# Patient Record
Sex: Female | Born: 1985 | Race: White | Hispanic: No | Marital: Married | Smoking: Former smoker
Health system: Southern US, Community
[De-identification: ages and names within clinical notes are randomized; demographics above are authoritative.]

## PROBLEM LIST (undated history)

## (undated) ENCOUNTER — Inpatient Hospital Stay (HOSPITAL_COMMUNITY): Payer: Self-pay

## (undated) ENCOUNTER — Emergency Department (HOSPITAL_COMMUNITY): Admission: EM | Discharge status: Against Medical Advice | Payer: Managed Care, Other (non HMO)

## (undated) DIAGNOSIS — R112 Nausea with vomiting, unspecified: Secondary | ICD-10-CM

## (undated) DIAGNOSIS — F419 Anxiety disorder, unspecified: Secondary | ICD-10-CM

## (undated) DIAGNOSIS — N159 Renal tubulo-interstitial disease, unspecified: Secondary | ICD-10-CM

## (undated) DIAGNOSIS — A0472 Enterocolitis due to Clostridium difficile, not specified as recurrent: Secondary | ICD-10-CM

## (undated) DIAGNOSIS — K259 Gastric ulcer, unspecified as acute or chronic, without hemorrhage or perforation: Secondary | ICD-10-CM

## (undated) DIAGNOSIS — Z9889 Other specified postprocedural states: Secondary | ICD-10-CM

## (undated) DIAGNOSIS — J45909 Unspecified asthma, uncomplicated: Secondary | ICD-10-CM

## (undated) DIAGNOSIS — N83209 Unspecified ovarian cyst, unspecified side: Secondary | ICD-10-CM

## (undated) HISTORY — PX: EYE SURGERY: SHX253

## (undated) HISTORY — DX: Unspecified ovarian cyst, unspecified side: N83.209

## (undated) HISTORY — PX: BREAST ENHANCEMENT SURGERY: SHX7

## (undated) HISTORY — DX: Gastric ulcer, unspecified as acute or chronic, without hemorrhage or perforation: K25.9

## (undated) HISTORY — PX: LIPOSUCTION: SHX10

---

## 2011-03-24 ENCOUNTER — Ambulatory Visit: Payer: BC Managed Care – PPO

## 2011-03-24 DIAGNOSIS — M79609 Pain in unspecified limb: Secondary | ICD-10-CM

## 2011-07-29 ENCOUNTER — Encounter (HOSPITAL_COMMUNITY): Payer: Self-pay | Admitting: Emergency Medicine

## 2011-07-29 ENCOUNTER — Emergency Department (HOSPITAL_COMMUNITY)
Admission: EM | Admit: 2011-07-29 | Discharge: 2011-07-29 | Disposition: A | Discharge status: Home / Self Care | Payer: BC Managed Care – PPO | Attending: Emergency Medicine | Admitting: Emergency Medicine

## 2011-07-29 ENCOUNTER — Emergency Department (HOSPITAL_COMMUNITY): Payer: BC Managed Care – PPO

## 2011-07-29 DIAGNOSIS — M549 Dorsalgia, unspecified: Secondary | ICD-10-CM | POA: Insufficient documentation

## 2011-07-29 DIAGNOSIS — R202 Paresthesia of skin: Secondary | ICD-10-CM

## 2011-07-29 DIAGNOSIS — M79609 Pain in unspecified limb: Secondary | ICD-10-CM

## 2011-07-29 DIAGNOSIS — R450 Nervousness: Secondary | ICD-10-CM | POA: Insufficient documentation

## 2011-07-29 DIAGNOSIS — R209 Unspecified disturbances of skin sensation: Secondary | ICD-10-CM | POA: Insufficient documentation

## 2011-07-29 DIAGNOSIS — F17200 Nicotine dependence, unspecified, uncomplicated: Secondary | ICD-10-CM | POA: Insufficient documentation

## 2011-07-29 DIAGNOSIS — M79659 Pain in unspecified thigh: Secondary | ICD-10-CM

## 2011-07-29 HISTORY — DX: Anxiety disorder, unspecified: F41.9

## 2011-07-29 MED ORDER — ALPRAZOLAM 0.5 MG PO TABS
0.5000 mg | ORAL_TABLET | Freq: Once | ORAL | Status: AC
  Administered 2011-07-29: 0.5 mg via ORAL
  Filled 2011-07-29: qty 1

## 2011-07-29 MED ORDER — IBUPROFEN 600 MG PO TABS: 600.0000 mg | ORAL_TABLET | Freq: Four times a day (QID) | ORAL | Status: AC | PRN

## 2011-07-29 NOTE — ED Notes (Addendum)
Pt states she was on a 9 hr road trip yesterday and she was awoke from her sleep for C/O pain to Left thigh.  She came to ER because nothing relieved it/.  She has gone for an [place] now

## 2011-07-29 NOTE — ED Notes (Signed)
MD at bedside. Dr. Stienl at bedside.  

## 2011-07-29 NOTE — ED Notes (Signed)
Pt states she drove for 9 hrs on Monday and tonight she woke up with pain and numbness in her left leg   Pt states pain is in her thigh  Pt states it went completely numb and made it difficult for her to walk  Pt states now it feels heavy and sore

## 2011-07-29 NOTE — ED Notes (Signed)
Pt states she woke up at 3AM and had pain in her L thigh and states that her L leg went numb. She says she got home from a 9 hr road trip yesterday evening. She only stopped once and got out of the car. She states her L calf feels tight but denies pain in calf. She continues to c/o soreness and tightness to L thigh. Strong pedal pulses present bilaterally.

## 2011-07-29 NOTE — ED Notes (Signed)
Pt's family member called and demanded to speak to MD. Family member states she is a Engineer, civil (consulting) and will sue ER if "something is wrong with my sister's leg". Attempted to explain delay to pt's family member. Family member shouting on phone. Informed family member that patient is stable and waiting to see MD. Informed family member of HIPPA policy. Family member continues to interrupt explanation. Call deferred to Charge Nurse, Terri.

## 2011-07-29 NOTE — Progress Notes (Signed)
VASCULAR LAB PRELIMINARY  PRELIMINARY  PRELIMINARY  PRELIMINARY  Left lower extremity venous duplex completed.    Preliminary report:  Left:  No evidence of DVT, superficial thrombosis, or Baker's cyst.  Litzy Dicker D, RVS 07/29/2011, 8:50 AM

## 2011-07-29 NOTE — Discharge Instructions (Signed)
Your xrays and vascular doppler tests were read as being normal.  Take motrin as need for pain. Follow up with primary care doctor in 1 week if symptoms fail to improve/resolve. Return to ER if worse, leg swelling, redness, severe pain, numbness/weakness, fevers,  other concern.

## 2011-07-29 NOTE — ED Provider Notes (Signed)
History     CSN: [id number]  Arrival date & time 07/29/11  0341   First MD Initiated Contact with Patient 07/29/11 [phone]      Chief Complaint  Patient presents with  . Leg Pain    (Consider location/radiation/quality/duration/timing/severity/associated sxs/prior treatment) Patient is a 26 y.o. female presenting with leg pain. The history is provided by the patient.  Leg Pain   pt c/o awakening w left leg/thigh pain this morning. Constant, dull. Denies leg swelling. States recent 9 hr car trip w single stop. No hx dvt or pe. No cp or sob. States felt normal when went to bed, denies sleeping in funny or awkward position. Denies same symptoms previously. States left leg also felt numb/tingly. No weakness. Also notes some pain left lower back/buttock area. No hx ddd. No back injury or strain. No fever or chills.   Past Medical History  Diagnosis Date  . Anxiety     History reviewed. No pertinent past surgical history.  Family History  Problem Relation Age of Onset  . Diabetes Mother   . Cancer Other   . Diabetes Other   . Heart attack Other     History  Substance Use Topics  . Smoking status: Current Everyday Smoker    Types: Cigarettes  . Smokeless tobacco: Not on file  . Alcohol Use: Yes     rare    OB History    Grav Para Term Preterm Abortions TAB SAB Ect Mult Living                  Review of Systems  Constitutional: Negative for fever.  Gastrointestinal: Negative for abdominal pain.  Musculoskeletal: Positive for back pain.  Skin: Negative for rash.  Neurological: Negative for weakness.  Psychiatric/Behavioral: The patient is nervous/anxious.     Allergies  Dilaudid and Sulfa antibiotics  Home Medications  No current outpatient prescriptions on file.  BP 147/87  Pulse 90  Temp(Src) 97.9 F (36.6 C) (Oral)  Resp 16  Ht 5\' 10"  (1.778 m)  Wt 220 lb (99.791 kg)  BMI 31.57 kg/m2  SpO2 100%  LMP 07/06/2011  Physical Exam  Nursing note and vitals  reviewed. Constitutional: She is oriented to person, place, and time. She appears well-developed and well-nourished. No distress.  Eyes: No scleral icterus.  Neck: No tracheal deviation present.  Cardiovascular: Normal rate, regular rhythm, normal heart sounds and intact distal pulses.   Pulmonary/Chest: Effort normal and breath sounds normal. No respiratory distress.  Abdominal: Normal appearance. She exhibits no distension.  Musculoskeletal: She exhibits no edema.       ctls spine aligned no step off, non tender. Left lumbar tenderness, tenderness at left sciatic notch. Straight leg raise neg. Good rom at left hip and knee without pain. Distal pulses palp. No sts or focal bony tenderness to left thight. No skin changes or erythema.   Neurological: She is alert and oriented to person, place, and time.       Motor intact bil.   Skin: Skin is warm and dry. No rash noted.  Psychiatric: She has a normal mood and affect.    ED Course  Procedures (including critical care time) Dg Lumbar Spine Complete  07/29/2011  *RADIOLOGY REPORT*  Clinical Data: Left leg pain and weakness for 1 day.  LUMBAR SPINE - COMPLETE 4+ VIEW  Comparison: None.  Findings: There are five lumbar type vertebral bodies.  Alignment is normal.  There is no evidence of fracture or pars defect.  The lumbar disc spaces are preserved.  IMPRESSION: Normal lumbar spine radiographs.  Original Report Authenticated By: Gerrianne Scale, M.D.      MDM  C/o left leg pain, recent prolonged road trip in car. Will get vascular dopplers.  Pt also notes hx anxiety, states after symptoms began this morning she felt very anxious as if was having anxiety attack. Pt given reassurance. Xanax po.    Vascular doppler report:    Patient Information       Patient Name  Sex  DOB  SSN    Deanna, Norton  Female  21-Sep-1985  [id number]             Progress Notes signed by Kerrin Champagne at 07/29/11 4098     Author:  Kerrin Champagne  Service:  (none)  Author Type:  Technician   Filed:  07/29/11 0852  Note Time:  07/29/11 0850          VASCULAR LAB  PRELIMINARY PRELIMINARY PRELIMINARY PRELIMINARY  Left lower extremity venous duplex completed.  Preliminary report: Left: No evidence of DVT, superficial thrombosis, or Baker's cyst.  SLAUGHTER, [place] D, RVS  07/29/2011, 8:50 AM    Discussed results w pt.   Recommend close pcp f/u. Return if worsening pain, swelling, other new symptoms.      Suzi Roots, MD 07/29/11 [phone]

## 2011-08-11 ENCOUNTER — Encounter: Payer: Self-pay | Admitting: Family Medicine

## 2011-08-11 ENCOUNTER — Ambulatory Visit (INDEPENDENT_AMBULATORY_CARE_PROVIDER_SITE_OTHER): Payer: Managed Care, Other (non HMO) | Admitting: Family Medicine

## 2011-08-11 VITALS — BP 130/98 | Temp 98.5°F | Ht 67.5 in | Wt 258.0 lb

## 2011-08-11 DIAGNOSIS — Z720 Tobacco use: Secondary | ICD-10-CM

## 2011-08-11 DIAGNOSIS — K829 Disease of gallbladder, unspecified: Secondary | ICD-10-CM | POA: Insufficient documentation

## 2011-08-11 DIAGNOSIS — K900 Celiac disease: Secondary | ICD-10-CM

## 2011-08-11 DIAGNOSIS — R5383 Other fatigue: Secondary | ICD-10-CM | POA: Insufficient documentation

## 2011-08-11 DIAGNOSIS — E739 Lactose intolerance, unspecified: Secondary | ICD-10-CM | POA: Insufficient documentation

## 2011-08-11 DIAGNOSIS — F17200 Nicotine dependence, unspecified, uncomplicated: Secondary | ICD-10-CM

## 2011-08-11 DIAGNOSIS — E663 Overweight: Secondary | ICD-10-CM | POA: Insufficient documentation

## 2011-08-11 DIAGNOSIS — K9041 Non-celiac gluten sensitivity: Secondary | ICD-10-CM

## 2011-08-11 DIAGNOSIS — R5381 Other malaise: Secondary | ICD-10-CM

## 2011-08-11 LAB — BASIC METABOLIC PANEL
BUN: 13 mg/dL (ref 6–23)
CO2: 24 mEq/L (ref 19–32)
Calcium: 8.7 mg/dL (ref 8.4–10.5)
Creatinine, Ser: 0.9 mg/dL (ref 0.4–1.2)
GFR: 77.9 mL/min (ref >60.00)
Glucose, Bld: 69 mg/dL — ABNORMAL LOW (ref 70–99)

## 2011-08-11 LAB — CBC WITH DIFFERENTIAL/PLATELET
Basophils Absolute: 0 10*3/uL (ref 0.0–0.1)
Basophils Relative: 0.6 % (ref 0.0–3.0)
Eosinophils Absolute: 0.1 10*3/uL (ref 0.0–0.7)
Lymphocytes Relative: 28.7 % (ref 12.0–46.0)
MCHC: 33.4 g/dL (ref 30.0–36.0)
MCV: 88.2 fl (ref 78.0–100.0)
Monocytes Absolute: 0.3 10*3/uL (ref 0.1–1.0)
Neutro Abs: 3.9 10*3/uL (ref 1.4–7.7)
Neutrophils Relative %: 64 % (ref 43.0–77.0)
RBC: 4.74 Mil/uL (ref 3.87–5.11)
RDW: 13 % (ref 11.5–14.6)

## 2011-08-11 LAB — POCT URINALYSIS DIPSTICK
Blood, UA: NEGATIVE
Glucose, UA: NEGATIVE
Nitrite, UA: NEGATIVE
Protein, UA: NEGATIVE
Spec Grav, UA: 1.02
Urobilinogen, UA: 0.2

## 2011-08-11 MED ORDER — VARENICLINE TARTRATE 1 MG PO TABS: ORAL_TABLET | ORAL | Status: DC

## 2011-08-11 NOTE — Patient Instructions (Signed)
Start the Chantix program,,,,,,,,,,, one half tab daily in the morning  Taper as outlined  Remember your SPF 50+ screens when he u go to [place]  Return the second week in July for followup  Continue your walking program  Avoid all foods and have fat lactose and gluten in them  When you come back we'll get you set up to see the general surgeon to discuss removing her gallbladder

## 2011-08-11 NOTE — Progress Notes (Signed)
  Subjective:    Patient ID: Deanna Norton, female    DOB: 1985-08-09, 26 y.o.   MRN: [id number]  HPI Deanna Norton is a 26 year old single female,,,,,,,,, going to be married in 18 days,,,,,,,,, who comes in today as a new patient for general physical examination  She smokes 10 packs of cigarettes a day and is trying to quit  She has a history of gluten and lactase deficiency both compound skewer abdominal cramps and diarrhea  Recently her weight has been going up. She's was 170 pounds in college she is now up to 250+. Thyroid disease runs in the family for the past 6 months she's been tired and no energy wonders if she might have thyroid disease  She has a history of anxiety and was seen a psychiatrist in [place] for a while. Not taking any medication  Review of systems she has contacts for distance vision, regular dental care, history of biliary dysfunction and gallbladder pain with eating had a HIDA scan done which showed an EF of 10% still symptomatic recommend general surgery consult  LMP the third week in May normal she stopped her BCPs she states she feels better off of them. She has seasonal allergic rhinitis  Father was an alcoholic drug addict has coronary disease in a smoker months a diabetic no brothers one sister tetanus booster 2009    Review of Systems  Constitutional: Negative.   HENT: Negative.   Eyes: Negative.   Respiratory: Negative.   Cardiovascular: Negative.   Gastrointestinal: Negative.   Genitourinary: Negative.   Musculoskeletal: Negative.   Neurological: Negative.   Hematological: Negative.   Psychiatric/Behavioral: Negative.        Objective:   Physical Exam  Constitutional: She appears well-developed and well-nourished.  HENT:  Head: Normocephalic and atraumatic.  Right Ear: External ear normal.  Left Ear: External ear normal.  Nose: Nose normal.  Mouth/Throat: Oropharynx is clear and moist.  Eyes: EOM are normal. Pupils are equal, round, and  reactive to light.  Neck: Normal range of motion. Neck supple. No thyromegaly present.  Cardiovascular: Normal rate, regular rhythm, normal heart sounds and intact distal pulses.  Exam reveals no gallop and no friction rub.   No murmur heard. Pulmonary/Chest: Effort normal and breath sounds normal.  Abdominal: Soft. Bowel sounds are normal. She exhibits no distension and no mass. There is no tenderness. There is no rebound.  Genitourinary:       Bilateral breast exam normal BSE taught  Musculoskeletal: Normal range of motion.  Lymphadenopathy:    She has no cervical adenopathy.  Neurological: She is alert. She has normal reflexes. No cranial nerve deficit. She exhibits normal muscle tone. Coordination normal.  Skin: Skin is warm and dry.  Psychiatric: She has a normal mood and affect. Her behavior is normal. Judgment and thought content normal.          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

## 2011-09-15 ENCOUNTER — Encounter: Payer: Self-pay | Admitting: Family Medicine

## 2011-09-15 ENCOUNTER — Ambulatory Visit (INDEPENDENT_AMBULATORY_CARE_PROVIDER_SITE_OTHER): Payer: Managed Care, Other (non HMO) | Admitting: Family Medicine

## 2011-09-15 VITALS — BP 140/98 | Temp 98.2°F | Wt 258.0 lb

## 2011-09-15 DIAGNOSIS — F17200 Nicotine dependence, unspecified, uncomplicated: Secondary | ICD-10-CM

## 2011-09-15 DIAGNOSIS — E663 Overweight: Secondary | ICD-10-CM

## 2011-09-15 DIAGNOSIS — M542 Cervicalgia: Secondary | ICD-10-CM

## 2011-09-15 DIAGNOSIS — K829 Disease of gallbladder, unspecified: Secondary | ICD-10-CM

## 2011-09-15 DIAGNOSIS — Z720 Tobacco use: Secondary | ICD-10-CM

## 2011-09-15 MED ORDER — TRAMADOL HCL 50 MG PO TABS: ORAL_TABLET | ORAL | Status: DC

## 2011-09-15 MED ORDER — DIAZEPAM 2 MG PO TABS: ORAL_TABLET | ORAL | Status: DC

## 2011-09-15 NOTE — Patient Instructions (Addendum)
Continue to taper the cigarettes  I have a request in for a consult with Dr. Harden Mo general surgeon  Valium and tramadol,,,,,,,,,,, one half of each bedtime as needed for neck pain Motrin 600 mg twice daily with food during the day,

## 2011-09-15 NOTE — Progress Notes (Signed)
  Subjective:    Patient ID: Deanna Norton, female    DOB: May 13, 1985, 26 y.o.   MRN: [id number]  HPI Deanna Norton  is a 26 year old recently married female who comes in today for followup of 3 issues  We had seen her a month or so ago and started her on a smoking cessation program with the Chantix program. She tapered from 10-3 cigarettes a day but did not start the medication  Her weight is unchanged at 258 pounds  She continues to have right upper quadrant abdominal pain. She had an evaluation in [place] at the [facility] and was told in 2010 she had biliary dyskinesia with a 10% EF. Her symptoms are getting worse over time. I will set her up a consult with Harden Mo  This morning before she went to work she turned her head to the right and experienced severe muscle spasm in her neck.   Review of Systems    general and neurologic review of systems and GI review of systems otherwise negative Objective:   Physical Exam Well-developed overweight female no acute distress examination and is negative except for some slight tenderness right upper quadrant no palpable masses  Neck exam is normal strength sensation reflexes upper extremities all normal       Assessment & Plan:  Obesity unchanged  Tobacco abuse continue to taper again encouraged to start the Chantix program  Left neck pain,,,,,,,,,,, Valium and tramadol when necessary  Biliary dyskinesia consult with Harden Mo

## 2011-10-08 ENCOUNTER — Telehealth: Payer: Self-pay | Admitting: Family Medicine

## 2011-10-08 NOTE — Telephone Encounter (Signed)
Caller: [name]/Patient; PCP: Roderick Pee.; CB#: [phone]; ; ; Call regarding Sore Throat;  Pt calling regarding sore throat that started 10/08/11, afebrile. Sinus pain. Has not taken any med for sore throat. Drinking well. LMP 08/24/11. Emergent s/s for Sore Throat r/o per protocol except for see in 24 hrs due to has enlarged tonsils covered by white patch. Appt sch for 10/09/11 at 12:30 pm Dr. Tawanna Cooler. Advised Tylenol po q 4 hrs prn and call back parameters given.

## 2011-10-09 ENCOUNTER — Encounter: Payer: Self-pay | Admitting: Family Medicine

## 2011-10-09 ENCOUNTER — Ambulatory Visit (INDEPENDENT_AMBULATORY_CARE_PROVIDER_SITE_OTHER): Payer: Managed Care, Other (non HMO) | Admitting: Family Medicine

## 2011-10-09 VITALS — BP 120/84 | Temp 98.9°F

## 2011-10-09 DIAGNOSIS — J069 Acute upper respiratory infection, unspecified: Secondary | ICD-10-CM | POA: Insufficient documentation

## 2011-10-09 DIAGNOSIS — J029 Acute pharyngitis, unspecified: Secondary | ICD-10-CM

## 2011-10-09 DIAGNOSIS — E663 Overweight: Secondary | ICD-10-CM

## 2011-10-09 DIAGNOSIS — J039 Acute tonsillitis, unspecified: Secondary | ICD-10-CM | POA: Insufficient documentation

## 2011-10-09 DIAGNOSIS — F17200 Nicotine dependence, unspecified, uncomplicated: Secondary | ICD-10-CM

## 2011-10-09 DIAGNOSIS — Z720 Tobacco use: Secondary | ICD-10-CM

## 2011-10-09 MED ORDER — HYDROCODONE-HOMATROPINE 5-1.5 MG/5ML PO SYRP: 5.0000 mL | ORAL_SOLUTION | Freq: Three times a day (TID) | ORAL | Status: DC | PRN

## 2011-10-09 MED ORDER — AMOXICILLIN 500 MG PO CAPS
ORAL_CAPSULE | ORAL | Status: DC
Start: 2011-10-09 — End: 2011-10-16

## 2011-10-09 NOTE — Patient Instructions (Addendum)
Gargle 4 times daily if the lesion on the right tonsil does not spontaneously resolve in a week return sooner we can remove it  Take the amoxicillin 1 twice a day for one week  Hydromet 1/2-1 teaspoon 3 times daily for cough and cold  Congratulations on being a ex- smoker

## 2011-10-09 NOTE — Progress Notes (Signed)
  Subjective:    Patient ID: Deanna Norton, female    DOB: [birth year], 26 y.o.   MRN: [id number]  HPI Deanna Norton  is a 26 year old recently married female who comes in today for evaluation of sore throat headache and cough  She states she felt well yesterday and developed a sore throat headache head congestion cough vomiting times one no diarrhea.  She states she's had a history in the past of cryptic tonsils with food getting stuck in her tonsils and now she feels pain on the right side.  Rapid strep negative   Review of Systems    general review of systems otherwise negative except for history of childhood asthma Objective:   Physical Exam  Well-developed well-nourished female no acute distress HEENT negative neck was supple no adenopathy lungs are clear except for cryptic tonsils with a piece of food skin is stuck in her right tonsillar cryptic area        Assessment & Plan:  Viral syndrome plan treat symptomatically  Tonsillitis secondary to cryptic tonsils gargle amoxicillin return in one week for removal of the lesion if it does not spontaneously resolve

## 2011-10-16 ENCOUNTER — Encounter (INDEPENDENT_AMBULATORY_CARE_PROVIDER_SITE_OTHER): Payer: Self-pay | Admitting: General Surgery

## 2011-10-16 ENCOUNTER — Ambulatory Visit (INDEPENDENT_AMBULATORY_CARE_PROVIDER_SITE_OTHER): Payer: Managed Care, Other (non HMO) | Admitting: General Surgery

## 2011-10-16 VITALS — BP 142/74 | HR 104 | Temp 98.3°F | Resp 20 | Ht 71.0 in | Wt 256.2 lb

## 2011-10-16 DIAGNOSIS — K829 Disease of gallbladder, unspecified: Secondary | ICD-10-CM

## 2011-10-16 NOTE — Progress Notes (Signed)
Patient ID: Deanna Norton, female   DOB: 03-12-1985, 26 y.o.   MRN: [id number]  Chief Complaint  Patient presents with  . New Evaluation    GB    HPI IYESHA Norton is a 26 y.o. female.  Referred by Dr. Alonza Smoker HPI Z. 26 year old female who is otherwise healthy who recently moved from [name]. She was evaluated in 2010 2011 after she began having severe right upper quadrant abdominal pain. By report she had an ultrasound that showed she did not have stones. I have a copy of her HIDA scan that shows her to have patency of both the cystic and common ducts. She has a gallbladder ejection fraction at 10% consistent with biliary dyskinesia. She was also administered cholecystokinin and had reproduction of her right-sided tenderness and cramping according to the note as well as according to the patient. Since then she has continued to have right upper quadrant pain mostly about several hours after eating most commonly with fatty foods. This is become much worse and is now with most meals that she eats. She recently has moved to [facility] and has been seen and referred for evaluation for biliary dyskinesia. Past Medical History  Diagnosis Date  . Anxiety   . Stomach ulcer     in college  . Ovarian cyst     History reviewed. No pertinent past surgical history.  Family History  Problem Relation Age of Onset  . Diabetes Mother   . Diabetes Other   . Heart attack Other   . Alcohol abuse Father   . Heart disease Father   . Hypertension Father   . Mental illness Father   . Cancer Maternal Grandmother     breast  . Hearing loss Paternal Grandfather     MI - 68   . Hypertension Paternal Grandfather   . Heart disease Paternal Grandfather     Social History History  Substance Use Topics  . Smoking status: Current Everyday Smoker -- 0.2 packs/day    Types: Cigarettes  . Smokeless tobacco: Not on file  . Alcohol Use: Yes     rare    Allergies  Allergen Reactions  .  Dilaudid (Hydromorphone Hcl)     IV   . Sulfa Antibiotics     No current outpatient prescriptions on file.    Review of Systems Review of Systems  Constitutional: Negative for fever, chills and unexpected weight change.  HENT: Positive for congestion. Negative for hearing loss, sore throat, trouble swallowing and voice change.   Eyes: Negative for visual disturbance.  Respiratory: Negative for cough and wheezing.   Cardiovascular: Negative for chest pain, palpitations and leg swelling.  Gastrointestinal: Negative for nausea, vomiting, abdominal pain, diarrhea, constipation, blood in stool, abdominal distention and anal bleeding.  Genitourinary: Negative for hematuria, vaginal bleeding and difficulty urinating.  Musculoskeletal: Negative for arthralgias.  Skin: Negative for rash and wound.  Neurological: Negative for seizures, syncope and headaches.  Hematological: Negative for adenopathy. Does not bruise/bleed easily.  Psychiatric/Behavioral: Negative for confusion.    Blood pressure 142/74, pulse 104, temperature 98.3 F (36.8 C), temperature source Temporal, resp. rate 20, height 5\' 11"  (1.803 m), weight 256 lb 3.2 oz (116.212 kg), last menstrual period 09/24/2011.  Physical Exam Physical Exam  Vitals reviewed. Constitutional: She appears well-developed and well-nourished.  Eyes: No scleral icterus.  Cardiovascular: Normal rate, regular rhythm and normal heart sounds.   Pulmonary/Chest: Effort normal and breath sounds normal. No respiratory distress. She has no wheezes.  She has no rales.  Abdominal: Soft. Normal appearance and bowel sounds are normal. There is tenderness in the right upper quadrant. There is positive Murphy's sign. No hernia.    Data Reviewed HIDA scan from [facility] reviewed  Assessment    Biliary dyskinesia    Plan    I do think her symptoms are referable to her gallbladder. She has fairly classic symptoms when you talk to her. She also has a  high-grade scan shows an ejection fraction of 10% that she had reproduction of her symptoms with injection of CCK. I think there is a high likelihood that we can get her better with a laparoscopic cholecystectomy and don't think any  further workup is necessary right now. I discussed the procedure in detail.  The patient was given Agricultural engineer.  We discussed the risks and benefits of a laparoscopic cholecystectomy and possible cholangiogram including, but not limited to bleeding, infection, injury to surrounding structures Norton as the intestine or liver, bile leak, retained gallstones, need to convert to an open procedure, prolonged diarrhea, blood clots Norton as  DVT, common bile duct injury, anesthesia risks, and possible need for additional procedures.  The likelihood of improvement in symptoms and return to the patient's normal status is good. We discussed the typical post-operative recovery course.        Deanna Norton 10/16/2011, 4:07 PM

## 2011-10-16 NOTE — Patient Instructions (Signed)
[facility] LAPAROSCOPIC SURG[facility]: POST OP INSTRUCTIONS  Always review your discharge instruction sheet given to you by the facility where your surgery was performed. IF YOU HAVE DISABILITY OR FAMILY LEAVE FORMS, YOU MUST BRING THEM TO THE OFFICE FOR PROCESSING.   DO NOT GIVE THEM TO YOUR DOCTOR.  1. A prescription for pain medication may be given to you upon discharge.  Take your pain medication as prescribed, if needed.  If narcotic pain medicine is not needed, then you may take acetaminophen (Tylenol), naprosyn (Alleve), or ibuprofen (Advil) as needed. 2. Take your usually prescribed medications unless otherwise directed. 3. If you need a refill on your pain medication, please contact your pharmacy.  They will contact our office to request authorization. Prescriptions will not be filled after 5pm or on week-ends. 4. You should follow a light diet the first few days after arrival home, such as soup and crackers, etc.  Be sure to include lots of fluids daily. 5. Most patients will experience some swelling and bruising in the area of the incisions.  Ice packs will help.  Swelling and bruising can take several days to resolve.  6. It is common to experience some constipation if taking pain medication after surgery.  Increasing fluid intake and taking a stool softener (such as Colace) will usually help or prevent this problem from occurring.  A mild laxative (Milk of Magnesia or Miralax) should be taken according to package instructions if there are no bowel movements after 48 hours. 7. Unless discharge instructions indicate otherwise, you may remove your bandages 48 hours after surgery, and you may shower at that time.  You may have steri-strips (small skin tapes) in place directly over the incision.  These strips should be left on the skin for 7-10 days.  If your surgeon used skin glue on the incision, you may shower in 24 hours.  The glue will flake  off over the next 2-3 weeks.  Any sutures or staples will be removed at the office during your follow-up visit. 8. ACTIVITIES:  You may resume regular (light) daily activities beginning the next day--such as daily self-care, walking, climbing stairs--gradually increasing activities as tolerated.  You may have sexual intercourse when it is comfortable.  Refrain from any heavy lifting or straining until approved by your doctor. a. You may drive when you are no longer taking prescription pain medication, you can comfortably wear a seatbelt, and you can safely maneuver your car and apply brakes. b. RETURN TO WORK:  __________________________________________________________ 9. You should see your doctor in the office for a follow-up appointment approximately 2-3 weeks after your surgery.  Make sure that you call for this appointment within a day or two after you arrive home to insure a convenient appointment time. 10. OTHER INSTRUCTIONS: __________________________________________________________________________________________________________________________ __________________________________________________________________________________________________________________________ WHEN TO CALL YOUR DOCTOR: 1. Fever over 101.0 2. Inability to urinate 3. Continued bleeding from incision. 4. Increased pain, redness, or drainage from the incision. 5. Increasing abdominal pain  The clinic staff is available to answer your questions during regular business hours.  Please don't hesitate to call and ask to speak to one of the nurses for clinical concerns.  If you have a medical emergency, go to the nearest emergency room or call 911.  A surgeon from [facility] is always on call at the hospital. [address], [address], [place], [place]  [address] ? [address] [address], [place], [place]   [address] [phone] ? [phone] ? [phone] Web site: [email or web address]  

## 2011-12-01 ENCOUNTER — Encounter (INDEPENDENT_AMBULATORY_CARE_PROVIDER_SITE_OTHER): Payer: Managed Care, Other (non HMO) | Admitting: General Surgery

## 2011-12-23 ENCOUNTER — Encounter: Payer: Self-pay | Admitting: Family Medicine

## 2011-12-23 ENCOUNTER — Ambulatory Visit (INDEPENDENT_AMBULATORY_CARE_PROVIDER_SITE_OTHER)
Admission: RE | Admit: 2011-12-23 | Discharge: 2011-12-23 | Disposition: A | Discharge status: Home / Self Care | Payer: Managed Care, Other (non HMO) | Source: Ambulatory Visit | Attending: Family Medicine | Admitting: Family Medicine

## 2011-12-23 ENCOUNTER — Ambulatory Visit (INDEPENDENT_AMBULATORY_CARE_PROVIDER_SITE_OTHER): Payer: Managed Care, Other (non HMO) | Admitting: Family Medicine

## 2011-12-23 VITALS — BP 110/70 | Temp 98.1°F | Wt 256.0 lb

## 2011-12-23 DIAGNOSIS — R209 Unspecified disturbances of skin sensation: Secondary | ICD-10-CM

## 2011-12-23 DIAGNOSIS — F410 Panic disorder [episodic paroxysmal anxiety] without agoraphobia: Secondary | ICD-10-CM

## 2011-12-23 DIAGNOSIS — Z23 Encounter for immunization: Secondary | ICD-10-CM

## 2011-12-23 LAB — BASIC METABOLIC PANEL
CO2: 28 mEq/L (ref 19–32)
Calcium: 9.3 mg/dL (ref 8.4–10.5)
Creatinine, Ser: 1 mg/dL (ref 0.4–1.2)
GFR: 70.67 mL/min (ref >60.00)

## 2011-12-23 LAB — CBC WITH DIFFERENTIAL/PLATELET
Basophils Absolute: 0 10*3/uL (ref 0.0–0.1)
Basophils Relative: 0.4 % (ref 0.0–3.0)
Eosinophils Absolute: 0.3 10*3/uL (ref 0.0–0.7)
Lymphocytes Relative: 31.2 % (ref 12.0–46.0)
MCHC: 33.1 g/dL (ref 30.0–36.0)
MCV: 88.8 fl (ref 78.0–100.0)
Monocytes Absolute: 0.5 10*3/uL (ref 0.1–1.0)
Neutrophils Relative %: 58.2 % (ref 43.0–77.0)
Platelets: 289 10*3/uL (ref 150.0–400.0)
RDW: 13 % (ref 11.5–14.6)

## 2011-12-23 LAB — HEMOGLOBIN A1C: Hgb A1c MFr Bld: 5.3 % (ref 4.6–6.5)

## 2011-12-23 LAB — TSH: TSH: 2.03 u[IU]/mL (ref 0.35–5.50)

## 2011-12-23 LAB — POCT URINALYSIS DIPSTICK
Nitrite, UA: NEGATIVE
Protein, UA: NEGATIVE
Spec Grav, UA: 1.02
Urobilinogen, UA: 0.2

## 2011-12-23 LAB — CK: Total CK: 226 U/L — ABNORMAL HIGH (ref 7–177)

## 2011-12-23 LAB — T4, FREE: Free T4: 0.85 ng/dL (ref 0.60–1.60)

## 2011-12-23 MED ORDER — LORAZEPAM 0.5 MG PO TABS: 0.5000 mg | ORAL_TABLET | Freq: Two times a day (BID) | ORAL | Status: DC | PRN

## 2011-12-23 NOTE — Patient Instructions (Signed)
Labs today  X-rays today  Ativan if you needed for a panic attack  I will call you I gets her labs and x-rays back

## 2011-12-23 NOTE — Progress Notes (Signed)
  Subjective:    Patient ID: Deanna Norton, female    DOB: Aug 03, 1985, 26 y.o.   MRN: [id number]  HPI Michelina is a 26 year old married female nonsmoker who comes in today for evaluation of a 2 week history of numbness and tingling in both lower extremities  She states about 2-3 weeks ago she began noticing tingling in both lower extremities. About a week ago she began noticing a vibratory type sensation in both lower extremities. No history of trauma  She has had a history of panic attacks in the past and has seen a therapist. At one time she was taking medication but because of her family history,,,,,,,,, her father was an alcoholic,,,,,,,, she declines to take medication on a daily basis  She's always been in good health she's had no chronic health problems except her weight  They do not use birth control LMP a week ago normal 6 day period  Neurologic review of systems totally negative no bowel bladder dysfunction etc. etc. etc.  When she has these sensations which seem to be constant now it's now triggering her panic attacks   Review of Systems Gen. review of systems totally negative except for above    Objective:   Physical Exam Well-developed well-nourished overweight female in no acute distress cardiopulmonary exam negative abdominal exam negative examination lower extremity shows the skin appears to be normal pulses are normal there's no palpable tenderness sensation strength reflexes gait all normal       Assessment & Plan:  Sensation of tingling lower extremities etiology unknown check labs  Panic attacks Ativan 0.5 when necessary

## 2011-12-25 ENCOUNTER — Telehealth: Payer: Self-pay | Admitting: Family Medicine

## 2011-12-25 NOTE — Telephone Encounter (Signed)
Deanna Norton. talked with Deanna Norton her symptoms are still about the same. Her labs and x-rays are all normal except her CK is slightly elevated. Put in please a repeat CK level she'll come in Monday afternoon nonfasting

## 2011-12-25 NOTE — Telephone Encounter (Signed)
Done

## 2011-12-25 NOTE — Telephone Encounter (Signed)
Pt called req to get lab and xray results.

## 2011-12-27 LAB — VITAMIN D 1,25 DIHYDROXY
Vitamin D2 1, 25 (OH)2: <8 pg/mL
Vitamin D3 1, 25 (OH)2: 44 pg/mL

## 2011-12-29 ENCOUNTER — Other Ambulatory Visit (INDEPENDENT_AMBULATORY_CARE_PROVIDER_SITE_OTHER): Payer: Managed Care, Other (non HMO)

## 2011-12-29 DIAGNOSIS — R5381 Other malaise: Secondary | ICD-10-CM

## 2011-12-29 DIAGNOSIS — R5383 Other fatigue: Secondary | ICD-10-CM

## 2011-12-29 LAB — CK: Total CK: 99 U/L (ref 7–177)

## 2011-12-30 ENCOUNTER — Encounter (HOSPITAL_COMMUNITY): Admission: RE | Payer: Self-pay | Source: Ambulatory Visit

## 2011-12-30 ENCOUNTER — Ambulatory Visit (HOSPITAL_COMMUNITY)
Admission: RE | Admit: 2011-12-30 | Payer: Managed Care, Other (non HMO) | Source: Ambulatory Visit | Admitting: General Surgery

## 2011-12-30 SURGERY — LAPAROSCOPIC CHOLECYSTECTOMY
Anesthesia: General

## 2012-03-10 DIAGNOSIS — A0472 Enterocolitis due to Clostridium difficile, not specified as recurrent: Secondary | ICD-10-CM

## 2012-03-10 HISTORY — DX: Enterocolitis due to Clostridium difficile, not specified as recurrent: A04.72

## 2012-07-20 ENCOUNTER — Ambulatory Visit (INDEPENDENT_AMBULATORY_CARE_PROVIDER_SITE_OTHER): Payer: Managed Care, Other (non HMO) | Admitting: Family Medicine

## 2012-07-20 ENCOUNTER — Encounter: Payer: Self-pay | Admitting: Family Medicine

## 2012-07-20 VITALS — BP 110/80 | Temp 98.5°F | Wt 250.0 lb

## 2012-07-20 DIAGNOSIS — J45909 Unspecified asthma, uncomplicated: Secondary | ICD-10-CM | POA: Insufficient documentation

## 2012-07-20 DIAGNOSIS — F17200 Nicotine dependence, unspecified, uncomplicated: Secondary | ICD-10-CM

## 2012-07-20 DIAGNOSIS — Z720 Tobacco use: Secondary | ICD-10-CM

## 2012-07-20 DIAGNOSIS — J029 Acute pharyngitis, unspecified: Secondary | ICD-10-CM

## 2012-07-20 DIAGNOSIS — J069 Acute upper respiratory infection, unspecified: Secondary | ICD-10-CM

## 2012-07-20 MED ORDER — HYDROCODONE-HOMATROPINE 5-1.5 MG/5ML PO SYRP: 5.0000 mL | ORAL_SOLUTION | Freq: Three times a day (TID) | ORAL | Status: DC | PRN

## 2012-07-20 MED ORDER — PREDNISONE 20 MG PO TABS
ORAL_TABLET | ORAL | Status: DC
Start: 2012-07-20 — End: 2012-08-04

## 2012-07-20 NOTE — Progress Notes (Signed)
  Subjective:    Patient ID: Deanna Norton, female    DOB: 10/08/1985, 27 y.o.   MRN: [id number]  HPI Aarika is a 27 year old single female intermittent smoker,,,,,,,, she hasn't smoked for 3 days,,,,,,,,, who comes in today with a three-day history of sore throat head congestion cough and tightness in her chest  She recently won a trip to [facility].  Her symptoms started on Sunday. No fever  She has had a history of childhood asthma   Review of Systems Review of systems otherwise negative    Objective:   Physical Exam Well-developed well-nourished female no acute distress HEENT negative neck was supple no adenopathy lungs are clear except for some symmetrical late expiratory mild wheezing       Assessment & Plan:  Viral syndrome with secondary asthma plan treat symptomatically

## 2012-07-20 NOTE — Patient Instructions (Signed)
Drink lots of water  Aspirin 2 tabs 3 times daily when necessary  No smoking  Hydromet,,,,,,,,,,, 1/2-1 teaspoon 3 times daily. For cough  Prednisone 20 mg,,,,,,,,,,, use as directed for wheezing  Return when necessary

## 2012-07-27 DIAGNOSIS — N159 Renal tubulo-interstitial disease, unspecified: Secondary | ICD-10-CM

## 2012-07-27 HISTORY — DX: Renal tubulo-interstitial disease, unspecified: N15.9

## 2012-08-04 ENCOUNTER — Emergency Department (HOSPITAL_COMMUNITY)
Admission: EM | Admit: 2012-08-04 | Discharge: 2012-08-04 | Disposition: A | Discharge status: Home / Self Care | Payer: Managed Care, Other (non HMO) | Attending: Emergency Medicine | Admitting: Emergency Medicine

## 2012-08-04 ENCOUNTER — Encounter (HOSPITAL_COMMUNITY): Payer: Self-pay | Admitting: *Deleted

## 2012-08-04 DIAGNOSIS — R350 Frequency of micturition: Secondary | ICD-10-CM | POA: Insufficient documentation

## 2012-08-04 DIAGNOSIS — Z8742 Personal history of other diseases of the female genital tract: Secondary | ICD-10-CM | POA: Insufficient documentation

## 2012-08-04 DIAGNOSIS — R109 Unspecified abdominal pain: Secondary | ICD-10-CM | POA: Insufficient documentation

## 2012-08-04 DIAGNOSIS — Z8659 Personal history of other mental and behavioral disorders: Secondary | ICD-10-CM | POA: Insufficient documentation

## 2012-08-04 DIAGNOSIS — Z3202 Encounter for pregnancy test, result negative: Secondary | ICD-10-CM | POA: Insufficient documentation

## 2012-08-04 DIAGNOSIS — Z8719 Personal history of other diseases of the digestive system: Secondary | ICD-10-CM | POA: Insufficient documentation

## 2012-08-04 DIAGNOSIS — N12 Tubulo-interstitial nephritis, not specified as acute or chronic: Secondary | ICD-10-CM

## 2012-08-04 DIAGNOSIS — F17200 Nicotine dependence, unspecified, uncomplicated: Secondary | ICD-10-CM | POA: Insufficient documentation

## 2012-08-04 DIAGNOSIS — Z87440 Personal history of urinary (tract) infections: Secondary | ICD-10-CM | POA: Insufficient documentation

## 2012-08-04 LAB — POCT I-STAT, CHEM 8
BUN: 14 mg/dL (ref 6–23)
Chloride: 108 mEq/L (ref 96–112)
HCT: 42 % (ref 36.0–46.0)
Sodium: 142 mEq/L (ref 135–145)
TCO2: 25 mmol/L (ref 0–100)

## 2012-08-04 LAB — URINE MICROSCOPIC-ADD ON

## 2012-08-04 LAB — URINALYSIS, ROUTINE W REFLEX MICROSCOPIC
Bilirubin Urine: NEGATIVE
Protein, ur: 30 mg/dL — AB
Urobilinogen, UA: 0.2 mg/dL (ref 0.0–1.0)

## 2012-08-04 LAB — PREGNANCY, URINE: Preg Test, Ur: NEGATIVE

## 2012-08-04 MED ORDER — DEXTROSE 5 % IV SOLN
1.0000 g | Freq: Once | INTRAVENOUS | Status: AC
  Administered 2012-08-04: 1 g via INTRAVENOUS
  Filled 2012-08-04: qty 10

## 2012-08-04 MED ORDER — SODIUM CHLORIDE 0.9 % IV SOLN
INTRAVENOUS | Status: DC
  Administered 2012-08-04: 05:00:00 via INTRAVENOUS

## 2012-08-04 MED ORDER — NAPROXEN 500 MG PO TABS: 500.0000 mg | ORAL_TABLET | Freq: Two times a day (BID) | ORAL | Status: DC

## 2012-08-04 MED ORDER — CEPHALEXIN 500 MG PO CAPS: ORAL_CAPSULE | ORAL | Status: DC

## 2012-08-04 MED ORDER — KETOROLAC TROMETHAMINE 30 MG/ML IJ SOLN
30.0000 mg | Freq: Once | INTRAMUSCULAR | Status: AC
Start: 2012-08-04 — End: 2012-08-04
  Administered 2012-08-04: 30 mg via INTRAVENOUS
  Filled 2012-08-04: qty 1

## 2012-08-04 NOTE — ED Provider Notes (Signed)
History     CSN: [id number]  Arrival date & time 08/04/12  0229   First MD Initiated Contact with Patient 08/04/12 0405      Chief Complaint  Patient presents with  . Hematuria    (Consider location/radiation/quality/duration/timing/severity/associated sxs/prior treatment) HPI Deanna Norton is a 27 y.o. female with a history of recurrent urinary tract infections presents emergency department complaining of urinary frequency, hematuria, and bilateral flank pain.  Symptom onset began at 2 a.m. when patient woke up to use the restroom.  She denies any nausea, vomiting, abnormal vaginal discharge/odors, dyspareunia, abdominal pain, or fevers.  Patient did not have any precipitating dysuria or urinary frequency.  She reports that this happened the last time she had pyelonephritis.  No other complaints this time.  Past Medical History  Diagnosis Date  . Anxiety   . Stomach ulcer     in college  . Ovarian cyst     History reviewed. No pertinent past surgical history.  Family History  Problem Relation Age of Onset  . Diabetes Mother   . Diabetes Other   . Heart attack Other   . Alcohol abuse Father   . Heart disease Father   . Hypertension Father   . Mental illness Father   . Cancer Maternal Grandmother     breast  . Hearing loss Paternal Grandfather     MI - 37   . Hypertension Paternal Grandfather   . Heart disease Paternal Grandfather     History  Substance Use Topics  . Smoking status: Current Every Day Smoker -- 0.25 packs/day    Types: Cigarettes  . Smokeless tobacco: Not on file  . Alcohol Use: Yes     Comment: rare    OB History   Grav Para Term Preterm Abortions TAB SAB Ect Mult Living                  Review of Systems Ten systems reviewed and are negative for acute change, except as noted in the HPI.    Allergies  Dilaudid and Sulfa antibiotics  Home Medications   Current Outpatient Rx  Name  Route  Sig  Dispense  Refill  . Norethin-Eth  Estrad-Fe Biphas (LO LOESTRIN FE PO)   Oral   Take 1 tablet by mouth daily.           BP 126/82  Pulse 104  Temp(Src) 98 F (36.7 C)  Resp 20  SpO2 98%  LMP 07/07/2012  Physical Exam  Nursing note and vitals reviewed. Constitutional: She is oriented to person, place, and time. She appears well-developed and well-nourished. No distress.  HENT:  Head: Normocephalic and atraumatic.  Eyes: Conjunctivae and EOM are normal.  Neck: Normal range of motion.  Cardiovascular:  Regular rate rhythm.  Intact distal pulses.  Pulmonary/Chest: Effort normal.  Lungs clear auscultation bilaterally and  Abdominal:  Mild suprapubic abdominal tenderness to palpation.  No peritoneal signs.  Genitourinary:  No CVA tenderness bilaterally  Musculoskeletal: Normal range of motion.  Neurological: She is alert and oriented to person, place, and time.  Skin: Skin is warm and dry. No rash noted. She is not diaphoretic.  Psychiatric: She has a normal mood and affect. Her behavior is normal.    ED Course  Procedures (including critical care time)  Labs Reviewed  URINALYSIS, ROUTINE W REFLEX MICROSCOPIC - Abnormal; Notable for the following:    APPearance CLOUDY (*)    Hgb urine dipstick LARGE (*)    Protein,  ur 30 (*)    Leukocytes, UA LARGE (*)    All other components within normal limits  URINE CULTURE  PREGNANCY, URINE  URINE MICROSCOPIC-ADD ON   No results found.   No diagnosis found.   BP 126/82  Pulse 104  Temp(Src) 98 F (36.7 C)  Resp 20  SpO2 98%  LMP 07/07/2012  MDM  Pyelonephritis IV Rocephin given in the emergency department.  Will discharge with Keflex.  Urine culture sent.  PCP followup recommended.        Jaci Carrel, [place] 08/04/12 [phone]

## 2012-08-04 NOTE — ED Provider Notes (Signed)
Medical screening examination/treatment/procedure(s) were performed by non-physician practitioner and as supervising physician I was immediately available for consultation/collaboration.  Rucker Pridgeon, MD 08/04/12 1815 

## 2012-08-04 NOTE — ED Notes (Signed)
Pt states woke up an hour ago with blood in urine and bladder and back pain

## 2012-08-06 LAB — URINE CULTURE: Colony Count: >=100000

## 2012-08-08 ENCOUNTER — Telehealth (HOSPITAL_COMMUNITY): Payer: Self-pay | Admitting: Emergency Medicine

## 2012-08-08 NOTE — ED Notes (Signed)
Post ED Visit - Positive Culture Follow-up  Culture report reviewed by antimicrobial stewardship pharmacist: []  Wes Dulaney, Pharm.D., BCPS []  Celedonio Miyamoto, [address].D., BCPS []  Georgina Pillion, Pharm.D., BCPS []  [place], [address].D., BCPS, AAHIVP [x]  Estella Husk, Pharm.D., BCPS, AAHIVP  Positive urine culture Treated with Keflex, organism sensitive to the same and no further patient follow-up is required at this time.  Deanna Norton 08/08/2012, 1:10 PM

## 2012-08-09 ENCOUNTER — Ambulatory Visit (INDEPENDENT_AMBULATORY_CARE_PROVIDER_SITE_OTHER): Payer: Managed Care, Other (non HMO) | Admitting: Family Medicine

## 2012-08-09 ENCOUNTER — Encounter: Payer: Self-pay | Admitting: Family Medicine

## 2012-08-09 VITALS — BP 140/90 | Temp 98.5°F | Wt 253.0 lb

## 2012-08-09 DIAGNOSIS — N390 Urinary tract infection, site not specified: Secondary | ICD-10-CM | POA: Insufficient documentation

## 2012-08-09 DIAGNOSIS — R197 Diarrhea, unspecified: Secondary | ICD-10-CM | POA: Insufficient documentation

## 2012-08-09 LAB — POCT URINALYSIS DIPSTICK
Ketones, UA: NEGATIVE
Nitrite, UA: NEGATIVE
Protein, UA: NEGATIVE

## 2012-08-09 MED ORDER — HYDROCODONE-HOMATROPINE 5-1.5 MG/5ML PO SYRP: ORAL_SOLUTION | ORAL | Status: DC

## 2012-08-09 NOTE — Patient Instructions (Signed)
Hold the Keflex  Clear liquid diet  ,Hydromet,,,,,,,,,,,,, 1/2 teaspoon 3 times daily  Stool culture today  I will call you and I get the report  Restart the Keflex on Wednesday but only take 1 tablet at bedtime until the bottle is empty   No hot tubs

## 2012-08-09 NOTE — Progress Notes (Signed)
  Subjective:    Patient ID: Deanna Norton, female    DOB: Jul 15, 1985, 27 y.o.   MRN: [id number]  HPI Deanna Norton is a 27 year old female smoker who comes in today for evaluation of 2 problems  She states last Wednesday at 1 AM she woke up with a burning and blood in urine. She went to the emergency room. Urine culture subsequently grew out an Escherichia coli with universal sensitivities. At that time she was afebrile her electrolytes renal function rub normal. Pregnancy test was negative. Again she was afebrile but she was told she had a kidney infection. She was given a gram or Rocephin and then put on oral Keflex. 48 hours later she developed abdominal cramping and diarrhea. She's had 8-10 bowel movements daily since that time. No fever no vomiting.  She's on BCPs  She last urinated tract infection was 2007  Prior to this unit tract infection which she was in a hot tub   Review of Systems    review of systems otherwise negative Objective:   Physical Exam Well-developed well-nourished female no acute distress examination the abdomen the bowel sounds are normal,,,,,,,,,,, abdomen not distended,,,,,,,, diffuse tenderness no rebound  Followup clean-catch urine shows a small amount of white cells small amount of blood otherwise urinalysis normal       Assessment & Plan:  Urinary tract infection resolving  Diarrhea question etiology within 48 hours of starting antibiotics I do not feel this is C. Difficile colitis however we will get a stool sample

## 2012-08-10 ENCOUNTER — Ambulatory Visit: Payer: Managed Care, Other (non HMO) | Admitting: Family Medicine

## 2012-08-11 ENCOUNTER — Other Ambulatory Visit: Payer: Self-pay | Admitting: Family Medicine

## 2012-08-11 DIAGNOSIS — A0472 Enterocolitis due to Clostridium difficile, not specified as recurrent: Secondary | ICD-10-CM

## 2012-08-11 LAB — CLOSTRIDIUM DIFFICILE BY PCR: Toxigenic C. Difficile by PCR: DETECTED — CR

## 2012-08-11 MED ORDER — METRONIDAZOLE 500 MG PO TABS: 500.0000 mg | ORAL_TABLET | Freq: Three times a day (TID) | ORAL | Status: DC

## 2012-08-13 ENCOUNTER — Ambulatory Visit: Payer: Managed Care, Other (non HMO) | Admitting: Family Medicine

## 2012-08-17 ENCOUNTER — Telehealth: Payer: Self-pay | Admitting: *Deleted

## 2012-08-17 ENCOUNTER — Ambulatory Visit (INDEPENDENT_AMBULATORY_CARE_PROVIDER_SITE_OTHER): Payer: Managed Care, Other (non HMO) | Admitting: Family Medicine

## 2012-08-17 ENCOUNTER — Encounter: Payer: Self-pay | Admitting: Family Medicine

## 2012-08-17 VITALS — BP 140/100 | Temp 99.0°F | Wt 250.0 lb

## 2012-08-17 DIAGNOSIS — N390 Urinary tract infection, site not specified: Secondary | ICD-10-CM

## 2012-08-17 DIAGNOSIS — A0472 Enterocolitis due to Clostridium difficile, not specified as recurrent: Secondary | ICD-10-CM | POA: Insufficient documentation

## 2012-08-17 NOTE — Progress Notes (Signed)
  Subjective:    Patient ID: Deanna Norton, female    DOB: [birth year], 27 y.o.   MRN: [id number]  HPI Deanna Norton is a 27 year old female who comes in today for followup of urinary tract infection and C. Difficile colitis  She was seen in an urgent care setting a couple weeks ago because she woke up with severe back pain and hematuria. She was given 1 g of Rocephin and placed on Keflex and did well but then towards the end of the antibiotic she developed diarrhea. C. Difficile was positive. She was started on Flagyl and she's done well. No complications from the medication   Review of Systems    review of systems otherwise negative except she's had a history of recurrent urinary tract infections most of which have been bladder infections Objective:   Physical Exam Well-developed and nourished female no acute distress dull exam negative       Assessment & Plan:  Urinary tract infection resolved  C. Difficile colitis resolved with Flagyl advised to return when necessary

## 2012-08-17 NOTE — Telephone Encounter (Signed)
Spoke  With patient and labs and referral not needed at this time.

## 2012-08-17 NOTE — Telephone Encounter (Signed)
Patient is requesting Labs to check her kidney function and urine.  She would also like to know if she can have a referral to GI and she would prefer a female provider.  Okay to order?

## 2012-08-17 NOTE — Patient Instructions (Signed)
Do the things we discussed to prevent urinary tract infections  If you have urinary tract infections come here immediately or if it's nights or weekends go to the common urgent care.  You have the in and out cath for your urine culture and followup with [place] immediately

## 2012-08-26 ENCOUNTER — Telehealth: Payer: Self-pay | Admitting: Family Medicine

## 2012-08-26 NOTE — Telephone Encounter (Signed)
Patient Information:  Caller Name: Deanna Norton  Phone: [phone]  Patient: Deanna Norton  Gender: Female  DOB: 1986-01-23  Age: 27 Years  PCP: Deanna Norton)  Pregnant: No  Office Follow Up:  Does the office need to follow up with this patient?: No  Instructions For The Office: N/A  Deanna Norton Note:  Pt recently discharge over night on 5-27 for Kidney infection, Cephalexen started, Pt developed CDiff, switched to Flagyl on 5-31, per Pt Flagyl is now finished.  Due to time of call and no same day appts, consulted w/ Suandrea,Deanna Norton at office due to ED disp due to pain radiates into groin.  Deanna Norton does not want this Pt to go to ED.  Per Deanna Lair, Deanna Norton , Deanna Norton will call Pt back directly.  Pt verbalized understanding.  Symptoms  Reason For Call & Symptoms: Lower Back, Mid and Shoulder Pain  Reviewed Health History In EMR: Yes  Reviewed Medications In EMR: Yes  Reviewed Allergies In EMR: Yes  Reviewed Surgeries / Procedures: Yes  Date of Onset of Symptoms: 08/26/2012 OB / GYN:  LMP: 08/08/2012  Guideline(s) Used:  Back Pain  Disposition Per Guideline:   Go to ED Now (or to Office with PCP Approval)  Reason For Disposition Reached:   Pain radiates into groin, scrotum  Advice Given:  N/A  Patient Will Follow Care Advice:  YES

## 2012-08-26 NOTE — Telephone Encounter (Signed)
Spoke with patient and she will call in the morning if no improvement.  Appointment made Monday with Dr Tawanna Cooler.

## 2012-08-30 ENCOUNTER — Ambulatory Visit (INDEPENDENT_AMBULATORY_CARE_PROVIDER_SITE_OTHER): Payer: Managed Care, Other (non HMO) | Admitting: Family Medicine

## 2012-08-30 ENCOUNTER — Encounter: Payer: Self-pay | Admitting: Family Medicine

## 2012-08-30 VITALS — BP 110/80 | Temp 98.9°F | Wt 249.0 lb

## 2012-08-30 DIAGNOSIS — R300 Dysuria: Secondary | ICD-10-CM

## 2012-08-30 DIAGNOSIS — A0472 Enterocolitis due to Clostridium difficile, not specified as recurrent: Secondary | ICD-10-CM

## 2012-08-30 DIAGNOSIS — R197 Diarrhea, unspecified: Secondary | ICD-10-CM

## 2012-08-30 DIAGNOSIS — N390 Urinary tract infection, site not specified: Secondary | ICD-10-CM

## 2012-08-30 LAB — POCT URINALYSIS DIPSTICK
Blood, UA: NEGATIVE
Glucose, UA: NEGATIVE
Nitrite, UA: NEGATIVE
Protein, UA: NEGATIVE
Urobilinogen, UA: 0.2
pH, UA: 6.5

## 2012-08-30 NOTE — Progress Notes (Signed)
  Subjective:    Patient ID: Deanna Norton, female    DOB: 1985-05-11, 27 y.o.   MRN: [id number]  HPI Deanna Norton is a 27 year old female nonsmoker who comes in today for evaluation of couple problems  We treated her for antibiotic-induced C. Difficile colitis she took her last dose of medication 2 weeks ago. Her bowel movements have been normal until today when she had a loose bowel movement. She's concerned as C. Difficile colitis might be coming back although she only had one loose bowel movement today. No fever chills  What triggered all this was urinary tract in flexion in the past. She's had some back pain she's not sure if her behavior her urinary tract. She's having no urinary tract symptoms.  She's also having submitted lower thoracic and lumbar back pain. It started about 5 days ago. It's been fairly constant. She describes as a dull ache it hurts when she bends or twists.   Review of Systems    neurologic review of systems otherwise negative also GI review of systems negative Objective:   Physical Exam  Well-developed well-nourished female no acute distress examination the spine is normal there is palpable tenderness along the paraspinal muscles. Neurologic exam normal. Abdominal exam normal  Urinalysis normal      Assessment & Plan:  Musculoskeletal back pain,,,,,, Motrin 600 twice a day  ,,,,,,,,,, history of urinary tract infections,,,,,,,, normal urine today  ,,,,,,, recent history of C. Difficile colitis from antibiotics off antibiotics for 2 weeks now having loose bowel movements reculture

## 2012-08-30 NOTE — Patient Instructions (Signed)
Go to the lab now to get set up for followup stool culture  Motrin 60 mg twice a day with food for low back pain  We will call you and get your culture report back

## 2012-09-03 LAB — STOOL CULTURE

## 2012-09-07 NOTE — Progress Notes (Signed)
Left message on machine FOR OT

## 2012-11-23 ENCOUNTER — Ambulatory Visit (INDEPENDENT_AMBULATORY_CARE_PROVIDER_SITE_OTHER): Payer: Managed Care, Other (non HMO) | Admitting: Family Medicine

## 2012-11-23 ENCOUNTER — Encounter: Payer: Self-pay | Admitting: Family Medicine

## 2012-11-23 VITALS — BP 120/80 | Temp 98.6°F | Wt 248.0 lb

## 2012-11-23 DIAGNOSIS — Z23 Encounter for immunization: Secondary | ICD-10-CM

## 2012-11-23 DIAGNOSIS — J069 Acute upper respiratory infection, unspecified: Secondary | ICD-10-CM | POA: Diagnosis not present

## 2012-11-23 DIAGNOSIS — J029 Acute pharyngitis, unspecified: Secondary | ICD-10-CM

## 2012-11-23 NOTE — Progress Notes (Signed)
Chief Complaint  Patient presents with  . URI    sore throat, sinus pressure.     HPI:  Acute visit for sinus congestion: -started: 2 days ago -symptoms: sinus congestion, drainage, cough, bad sore throat, sinus pressure som ear fullness and pain -denies: fever, tooth pain, SOB, NVD, known strep throat exposure - but she is worried about strep -has tried: nyquil cold and sinus which helps   ROS: See pertinent positives and negatives per HPI.  Past Medical History  Diagnosis Date  . Anxiety   . Stomach ulcer     in college  . Ovarian cyst     No past surgical history on file.  Family History  Problem Relation Age of Onset  . Diabetes Mother   . Diabetes Other   . Heart attack Other   . Alcohol abuse Father   . Heart disease Father   . Hypertension Father   . Mental illness Father   . Cancer Maternal Grandmother     breast  . Hearing loss Paternal Grandfather     MI - 82   . Hypertension Paternal Grandfather   . Heart disease Paternal Grandfather     History   Social History  . Marital Status: Married    Spouse Name: N/A    Number of Children: N/A  . Years of Education: N/A   Social History Main Topics  . Smoking status: Current Every Day Smoker -- 0.25 packs/day    Types: Cigarettes  . Smokeless tobacco: None  . Alcohol Use: Yes     Comment: rare  . Drug Use: No  . Sexual Activity: None   Other Topics Concern  . None   Social History Narrative  . None    Current outpatient prescriptions:Norethin-Eth Estrad-Fe Biphas (LO LOESTRIN FE PO), Take 1 tablet by mouth daily., Disp: , Rfl:   EXAM:  Filed Vitals:   11/23/12 1052  BP: 120/80  Temp: 98.6 F (37 C)    Body mass index is 34.6 kg/(m^2).  GENERAL: vitals reviewed and listed above, alert, oriented, appears well hydrated and in no acute distress  HEENT: atraumatic, conjunttiva clear, no obvious abnormalities on inspection of external nose and ears, normal appearance of ear canals and  TMs, clear nasal congestion, mild post oropharyngeal erythema with PND, no tonsillar edema or exudate, no sinus TTP  NECK: no obvious masses on inspection  LUNGS: clear to auscultation bilaterally, no wheezes, rales or rhonchi, good air movement  CV: HRRR, no peripheral edema  MS: moves all extremities without noticeable abnormality  PSYCH: pleasant and cooperative, no obvious depression or anxiety  ASSESSMENT AND PLAN:  Discussed the following assessment and plan:  Sore throat - Plan: POC Rapid Strep A  Upper respiratory infection  -discussed this is likely viral with no signs or symptoms to suggest serious bacterial illness -advised supportive care, return precautions -flu vaccine given after discussion risks -rapid strep neg -Patient advised to return or notify a doctor immediately if symptoms worsen or persist or new concerns arise.  Patient Instructions  INSTRUCTIONS FOR UPPER RESPIRATORY INFECTION:  -plenty of rest and fluids  -nasal saline wash 2-3 times daily (use prepackaged nasal saline or bottled/distilled water if making your own)   -clean nose with nasal saline before using the nasal steroid or sinex  -can use sinex or Afrin nasal spray for drainage and nasal congestion - but do NOT use longer then 3-4 days  -can use tylenol or ibuprofen as directed for aches and  sorethroat  -if you are taking a cough medication - use only as directed, may also try a teaspoon of honey to coat the throat and throat lozenges  -for sore throat, salt water gargles can help  -follow up if you have fevers, facial pain, tooth pain, difficulty breathing or are worsening or not getting better in 5-7 days      KIM, HANNAH R.

## 2012-11-23 NOTE — Addendum Note (Signed)
Addended by: Azucena Freed on: 11/23/2012 11:17 AM   Modules accepted: Orders

## 2012-11-23 NOTE — Patient Instructions (Addendum)
INSTRUCTIONS FOR UPPER RESPIRATORY INFECTION:  -plenty of rest and fluids  -nasal saline wash 2-3 times daily (use prepackaged nasal saline or bottled/distilled water if making your own)   -clean nose with nasal saline before using the nasal steroid or sinex  -can use sinex or Afrin nasal spray for drainage and nasal congestion - but do NOT use longer then 3-4 days  -can use tylenol or ibuprofen as directed for aches and sorethroat  -if you are taking a cough medication - use only as directed, may also try a teaspoon of honey to coat the throat and throat lozenges  -for sore throat, salt water gargles can help  -follow up if you have fevers, facial pain, tooth pain, difficulty breathing or are worsening or not getting better in 5-7 days

## 2012-12-13 ENCOUNTER — Encounter: Payer: Self-pay | Admitting: Family Medicine

## 2012-12-13 ENCOUNTER — Ambulatory Visit (INDEPENDENT_AMBULATORY_CARE_PROVIDER_SITE_OTHER): Payer: Managed Care, Other (non HMO) | Admitting: Family Medicine

## 2012-12-13 ENCOUNTER — Ambulatory Visit: Payer: Managed Care, Other (non HMO) | Admitting: Internal Medicine

## 2012-12-13 VITALS — BP 120/80 | Temp 98.7°F | Wt 246.0 lb

## 2012-12-13 DIAGNOSIS — E739 Lactose intolerance, unspecified: Secondary | ICD-10-CM

## 2012-12-13 DIAGNOSIS — K900 Celiac disease: Secondary | ICD-10-CM

## 2012-12-13 DIAGNOSIS — K9041 Non-celiac gluten sensitivity: Secondary | ICD-10-CM

## 2012-12-13 MED ORDER — HYOSCYAMINE SULFATE 0.125 MG SL SUBL: 0.1250 mg | SUBLINGUAL_TABLET | SUBLINGUAL | Status: DC | PRN

## 2012-12-13 NOTE — Patient Instructions (Signed)
Levsin........ one sublingual 4 times daily when necessary for abdominal cramping  I would go on a clear liquid diet for a day or 2 to resolve your symptoms  When you start eating be sure to stay away from all lactose and gluten  I will set you up a consult in GI for further evaluation

## 2012-12-13 NOTE — Progress Notes (Signed)
  Subjective:    Patient ID: Deanna Norton, female    DOB: 1986-03-08, 27 y.o.   MRN: [id number]  HPI Deanna Norton is a 27 year old married female X. smoker times one month who comes in today for evaluation of a 4 week history of loose bowel movements and abdominal cramps usually in the morning.  She states her symptoms started about a month ago with loose bowel movements and cramps 6 in the morning. She only have one bowel movement a day. Last Thursday she started having 3 bowel movements a day. She's a history of lactose intolerance and gluten deficiency however the diagnostic blood tests for sprue were negative.  She has no fever vomiting.  LMP 913 to September 18 normal she's on BCPs.  Family history mom has same symptoms sisters been diagnosed with the constipation and diarrhea-type IBS   Review of Systems    review of systems otherwise negative Objective:   Physical Exam  Well-developed well nourished female no acute distress examination the abdomen the abdomen is flat bowel sounds are normal there is some tenderness in the right descending: The left descending colon however no rebound no palpable masses rectal exam normal stool guaiac-negative      Assessment & Plan:  Loose bowel movements with abdominal cramps in the morning etiology unknown plan GI,

## 2013-01-19 LAB — OB RESULTS CONSOLE ANTIBODY SCREEN: ANTIBODY SCREEN: NEGATIVE

## 2013-01-19 LAB — OB RESULTS CONSOLE GC/CHLAMYDIA
Chlamydia: NEGATIVE
GC PROBE AMP, GENITAL: NEGATIVE

## 2013-01-19 LAB — OB RESULTS CONSOLE ABO/RH: RH Type: POSITIVE

## 2013-01-19 LAB — OB RESULTS CONSOLE RPR: RPR: NONREACTIVE

## 2013-01-19 LAB — OB RESULTS CONSOLE RUBELLA ANTIBODY, IGM: Rubella: IMMUNE

## 2013-01-19 LAB — OB RESULTS CONSOLE HEPATITIS B SURFACE ANTIGEN: Hepatitis B Surface Ag: NEGATIVE

## 2013-01-19 LAB — OB RESULTS CONSOLE HIV ANTIBODY (ROUTINE TESTING): HIV: NONREACTIVE

## 2013-02-07 ENCOUNTER — Encounter: Payer: Self-pay | Admitting: Family Medicine

## 2013-02-07 ENCOUNTER — Ambulatory Visit (INDEPENDENT_AMBULATORY_CARE_PROVIDER_SITE_OTHER): Payer: Managed Care, Other (non HMO) | Admitting: Family Medicine

## 2013-02-07 VITALS — BP 100/70 | Temp 99.2°F | Wt 246.0 lb

## 2013-02-07 DIAGNOSIS — K5289 Other specified noninfective gastroenteritis and colitis: Secondary | ICD-10-CM

## 2013-02-07 DIAGNOSIS — K529 Noninfective gastroenteritis and colitis, unspecified: Secondary | ICD-10-CM

## 2013-02-07 NOTE — Progress Notes (Signed)
Pre visit review using our clinic review tool, if applicable. No additional management support is needed unless otherwise documented below in the visit note. 

## 2013-02-07 NOTE — Patient Instructions (Signed)
Viral Gastroenteritis Viral gastroenteritis is also known as stomach flu. This condition affects the stomach and intestinal tract. It can cause sudden diarrhea and vomiting. The illness typically lasts 3 to 8 days. Most people develop an immune response that eventually gets rid of the virus. While this natural response develops, the virus can make you quite ill. CAUSES  Many different viruses can cause gastroenteritis, such as rotavirus or noroviruses. You can catch one of these viruses by consuming contaminated food or water. You may also catch a virus by sharing utensils or other personal items with an infected person or by touching a contaminated surface. SYMPTOMS  The most common symptoms are diarrhea and vomiting. These problems can cause a severe loss of body fluids (dehydration) and a body salt (electrolyte) imbalance. Other symptoms may include:  Fever.  Headache.  Fatigue.  Abdominal pain. DIAGNOSIS  Your caregiver can usually diagnose viral gastroenteritis based on your symptoms and a physical exam. A stool sample may also be taken to test for the presence of viruses or other infections. TREATMENT  This illness typically goes away on its own. Treatments are aimed at rehydration. The most serious cases of viral gastroenteritis involve vomiting so severely that you are not able to keep fluids down. In these cases, fluids must be given through an intravenous line (IV). HOME CARE INSTRUCTIONS   Drink enough fluids to keep your urine clear or pale yellow. Drink small amounts of fluids frequently and increase the amounts as tolerated.  Ask your caregiver for specific rehydration instructions.  Avoid:  Foods high in sugar.  Alcohol.  Carbonated drinks.  Tobacco.  Juice.  Caffeine drinks.  Extremely hot or cold fluids.  Fatty, greasy foods.  Too much intake of anything at one time.  Dairy products until 24 to 48 hours after diarrhea stops.  You may consume probiotics.  Probiotics are active cultures of beneficial bacteria. They may lessen the amount and number of diarrheal stools in adults. Probiotics can be found in yogurt with active cultures and in supplements.  Wash your hands well to avoid spreading the virus.  Only take over-the-counter or prescription medicines for pain, discomfort, or fever as directed by your caregiver. Do not give aspirin to children. Antidiarrheal medicines are not recommended.  Ask your caregiver if you should continue to take your regular prescribed and over-the-counter medicines.  Keep all follow-up appointments as directed by your caregiver. SEEK IMMEDIATE MEDICAL CARE IF:   You are unable to keep fluids down.  You do not urinate at least once every 6 to 8 hours.  You develop shortness of breath.  You notice blood in your stool or vomit. This may look like coffee grounds.  You have abdominal pain that increases or is concentrated in one small area (localized).  You have persistent vomiting or diarrhea.  You have a fever.  The patient is a child younger than 3 months, and he or she has a fever.  The patient is a child older than 3 months, and he or she has a fever and persistent symptoms.  The patient is a child older than 3 months, and he or she has a fever and symptoms suddenly get worse.  The patient is a baby, and he or she has no tears when crying. MAKE SURE YOU:   Understand these instructions.  Will watch your condition.  Will get help right away if you are not doing well or get worse. Document Released: 02/24/2005 Document Revised: 05/19/2011 Document Reviewed: 12/11/2010   [employer] Patient Information 2014 [employer].  

## 2013-02-07 NOTE — Progress Notes (Signed)
Chief Complaint  Patient presents with  . Fever    abdominal carmaps, nasuea, diarrhea, gas, bloating     HPI:  27 yo pt of Dr. Tawanna Cooler with PMH anxiety, gluten intolerance, lactose intolerance here for nausea and feeling sick: -started: 2 days ago - now improving with no vomiting or diarrhea today -symptoms:nausea, vomiting, watery diarrhea, abd cramping and "fever" of high of 100 -denies:fever > 100, vaginal bleeding, hematuria, focal abd pain, inability to tolerate fluids  -has tried: nothing -sick contacts/travel/risks: denies flu exposure, tick exposure or or Ebola risks, recent abx, recent travel, recent abx -she is currently [redacted] weeks pregnant  ROS: See pertinent positives and negatives per HPI.  Past Medical History  Diagnosis Date  . Anxiety   . Stomach ulcer     in college  . Ovarian cyst     No past surgical history on file.  Family History  Problem Relation Age of Onset  . Diabetes Mother   . Diabetes Other   . Heart attack Other   . Alcohol abuse Father   . Heart disease Father   . Hypertension Father   . Mental illness Father   . Cancer Maternal Grandmother     breast  . Hearing loss Paternal Grandfather     MI - 10   . Hypertension Paternal Grandfather   . Heart disease Paternal Grandfather     History   Social History  . Marital Status: Married    Spouse Name: N/A    Number of Children: N/A  . Years of Education: N/A   Social History Main Topics  . Smoking status: Current Every Day Smoker -- 0.25 packs/day    Types: Cigarettes  . Smokeless tobacco: None  . Alcohol Use: Yes     Comment: rare  . Drug Use: No  . Sexual Activity: None   Other Topics Concern  . None   Social History Narrative  . None    Current outpatient prescriptions:Norethin-Eth Estrad-Fe Biphas (LO LOESTRIN FE PO), Take 1 tablet by mouth daily., Disp: , Rfl:   EXAM:  Filed Vitals:   02/07/13 1358  BP: 100/70  Temp: 99.2 F (37.3 C)    Body mass index is 34.33  kg/(m^2).  GENERAL: vitals reviewed and listed above, alert, oriented, appears well hydrated and in no acute distress  HEENT: atraumatic, conjunttiva clear, no obvious abnormalities on inspection of external nose and ears  NECK: no obvious masses on inspection  LUNGS: clear to auscultation bilaterally, no wheezes, rales or rhonchi, good air movement  CV: HRRR, no peripheral edema  ABD: BS+, soft, NTTP  MS: moves all extremities without noticeable abnormality  PSYCH: pleasant and cooperative, no obvious depression or anxiety  ASSESSMENT AND PLAN:  Discussed the following assessment and plan:  Gastroenteritis  -given HPI and exam findings today, a serious infection or illness is unlikely. We discussed potential etiologies, with viral illness being most likely, and advised supportive care and monitoring. We discussed treatment side effects, likely course,transmission, and signs of developing a serious illness. -advised she notify her ob of symptoms, likely gastroenteritis and discuss safe medications - zofran she has on hand is ok to use -advised if unable to tolerate PO fluids, worsening, severe pain, not improving or other concerns to see doctor immediately -of course, we advised to return or notify a doctor immediately if symptoms worsen or persist or new concerns arise.    Patient Instructions  Viral Gastroenteritis Viral gastroenteritis is also known as stomach flu.  This condition affects the stomach and intestinal tract. It can cause sudden diarrhea and vomiting. The illness typically lasts 3 to 8 days. Most people develop an immune response that eventually gets rid of the virus. While this natural response develops, the virus can make you quite ill. CAUSES  Many different viruses can cause gastroenteritis, such as rotavirus or noroviruses. You can catch one of these viruses by consuming contaminated food or water. You may also catch a virus by sharing utensils or other personal  items with an infected person or by touching a contaminated surface. SYMPTOMS  The most common symptoms are diarrhea and vomiting. These problems can cause a severe loss of body fluids (dehydration) and a body salt (electrolyte) imbalance. Other symptoms may include:  Fever.  Headache.  Fatigue.  Abdominal pain. DIAGNOSIS  Your caregiver can usually diagnose viral gastroenteritis based on your symptoms and a physical exam. A stool sample may also be taken to test for the presence of viruses or other infections. TREATMENT  This illness typically goes away on its own. Treatments are aimed at rehydration. The most serious cases of viral gastroenteritis involve vomiting so severely that you are not able to keep fluids down. In these cases, fluids must be given through an intravenous line (IV). HOME CARE INSTRUCTIONS   Drink enough fluids to keep your urine clear or pale yellow. Drink small amounts of fluids frequently and increase the amounts as tolerated.  Ask your caregiver for specific rehydration instructions.  Avoid:  Foods high in sugar.  Alcohol.  Carbonated drinks.  Tobacco.  Juice.  Caffeine drinks.  Extremely hot or cold fluids.  Fatty, greasy foods.  Too much intake of anything at one time.  Dairy products until 24 to 48 hours after diarrhea stops.  You may consume probiotics. Probiotics are active cultures of beneficial bacteria. They may lessen the amount and number of diarrheal stools in adults. Probiotics can be found in yogurt with active cultures and in supplements.  Wash your hands well to avoid spreading the virus.  Only take over-the-counter or prescription medicines for pain, discomfort, or fever as directed by your caregiver. Do not give aspirin to children. Antidiarrheal medicines are not recommended.  Ask your caregiver if you should continue to take your regular prescribed and over-the-counter medicines.  Keep all follow-up appointments as  directed by your caregiver. SEEK IMMEDIATE MEDICAL CARE IF:   You are unable to keep fluids down.  You do not urinate at least once every 6 to 8 hours.  You develop shortness of breath.  You notice blood in your stool or vomit. This may look like coffee grounds.  You have abdominal pain that increases or is concentrated in one small area (localized).  You have persistent vomiting or diarrhea.  You have a fever.  The patient is a child younger than 3 months, and he or she has a fever.  The patient is a child older than 3 months, and he or she has a fever and persistent symptoms.  The patient is a child older than 3 months, and he or she has a fever and symptoms suddenly get worse.  The patient is a baby, and he or she has no tears when crying. MAKE SURE YOU:   Understand these instructions.  Will watch your condition.  Will get help right away if you are not doing well or get worse. Document Released: 02/24/2005 Document Revised: 05/19/2011 Document Reviewed: 12/11/2010 [facility] Patient Information 2014 [place], [place].  Colin Benton R.

## 2013-02-09 ENCOUNTER — Encounter: Payer: Self-pay | Admitting: Family Medicine

## 2013-03-10 NOTE — L&D Delivery Note (Signed)
Delivery Note At 12:05 AM a viable female was delivered via  (Presentation  OA: ;  ).  APGAR:9/9 , ; weight .   Placenta statusspont >>intact   , .  Cord:  with the following complications: .  Cord pH: not sent  Anesthesia:  epid Episiotomy: none Lacerations: sec deg Suture Repair: 3.0 vicryl rapide Est. Blood Loss (mL): 300  Mom to postpartum.  Baby to Nursery.  Cedar Roseman M 08/21/2013, 12:22 AM

## 2013-05-09 ENCOUNTER — Emergency Department (HOSPITAL_COMMUNITY)
Admission: EM | Admit: 2013-05-09 | Discharge: 2013-05-09 | Disposition: A | Discharge status: Home / Self Care | Payer: Managed Care, Other (non HMO) | Attending: Emergency Medicine | Admitting: Emergency Medicine

## 2013-05-09 ENCOUNTER — Encounter (HOSPITAL_COMMUNITY): Payer: Self-pay | Admitting: Emergency Medicine

## 2013-05-09 DIAGNOSIS — Z3490 Encounter for supervision of normal pregnancy, unspecified, unspecified trimester: Secondary | ICD-10-CM

## 2013-05-09 DIAGNOSIS — Z8719 Personal history of other diseases of the digestive system: Secondary | ICD-10-CM | POA: Insufficient documentation

## 2013-05-09 DIAGNOSIS — J45901 Unspecified asthma with (acute) exacerbation: Secondary | ICD-10-CM

## 2013-05-09 DIAGNOSIS — Z8659 Personal history of other mental and behavioral disorders: Secondary | ICD-10-CM | POA: Insufficient documentation

## 2013-05-09 DIAGNOSIS — O99330 Smoking (tobacco) complicating pregnancy, unspecified trimester: Secondary | ICD-10-CM | POA: Insufficient documentation

## 2013-05-09 DIAGNOSIS — O9989 Other specified diseases and conditions complicating pregnancy, childbirth and the puerperium: Secondary | ICD-10-CM | POA: Insufficient documentation

## 2013-05-09 DIAGNOSIS — Z8742 Personal history of other diseases of the female genital tract: Secondary | ICD-10-CM | POA: Insufficient documentation

## 2013-05-09 DIAGNOSIS — Z79899 Other long term (current) drug therapy: Secondary | ICD-10-CM | POA: Insufficient documentation

## 2013-05-09 HISTORY — DX: Unspecified asthma, uncomplicated: J45.909

## 2013-05-09 MED ORDER — METHYLPREDNISOLONE SODIUM SUCC 125 MG IJ SOLR
125.0000 mg | Freq: Once | INTRAMUSCULAR | Status: AC
  Administered 2013-05-09: 125 mg via INTRAVENOUS
  Filled 2013-05-09: qty 2

## 2013-05-09 MED ORDER — SODIUM CHLORIDE 0.9 % IV BOLUS (SEPSIS)
500.0000 mL | Freq: Once | INTRAVENOUS | Status: AC
  Administered 2013-05-09: 500 mL via INTRAVENOUS

## 2013-05-09 MED ORDER — ALBUTEROL SULFATE (2.5 MG/3ML) 0.083% IN NEBU
2.5000 mg | INHALATION_SOLUTION | Freq: Once | RESPIRATORY_TRACT | Status: AC
  Administered 2013-05-09: 2.5 mg via RESPIRATORY_TRACT
  Filled 2013-05-09: qty 3

## 2013-05-09 MED ORDER — ONDANSETRON HCL 4 MG/2ML IJ SOLN
4.0000 mg | Freq: Once | INTRAMUSCULAR | Status: AC
  Administered 2013-05-09: 4 mg via INTRAVENOUS
  Filled 2013-05-09: qty 2

## 2013-05-09 MED ORDER — IPRATROPIUM-ALBUTEROL 0.5-2.5 (3) MG/3ML IN SOLN
3.0000 mL | Freq: Once | RESPIRATORY_TRACT | Status: AC
  Administered 2013-05-09: 3 mL via RESPIRATORY_TRACT
  Filled 2013-05-09: qty 3

## 2013-05-09 MED ORDER — ALBUTEROL SULFATE (2.5 MG/3ML) 0.083% IN NEBU: 5.0000 mg | INHALATION_SOLUTION | Freq: Once | RESPIRATORY_TRACT | Status: DC

## 2013-05-09 MED ORDER — IPRATROPIUM BROMIDE 0.02 % IN SOLN: 0.5000 mg | Freq: Once | RESPIRATORY_TRACT | Status: DC

## 2013-05-09 MED ORDER — AZITHROMYCIN 250 MG PO TABS: 250.0000 mg | ORAL_TABLET | Freq: Every day | ORAL | Status: DC

## 2013-05-09 MED ORDER — PREDNISONE 20 MG PO TABS: 40.0000 mg | ORAL_TABLET | Freq: Every day | ORAL | Status: DC

## 2013-05-09 MED ORDER — ALBUTEROL SULFATE HFA 108 (90 BASE) MCG/ACT IN AERS: 2.0000 | INHALATION_SPRAY | RESPIRATORY_TRACT | Status: DC | PRN

## 2013-05-09 NOTE — Progress Notes (Signed)
6837  Arrived to evaluate this 27yo G1P0 @ 23.[redacted]wks GA with complaint of shortness of breath.  Reports distant history of asthma with no recent use of inhaler.  Also reports recent problems with allergies.  Currently having IV placed for IV solumedrol injection.  0850  Patient has received nebulizer treatment and medication for nausea and one episode of vomiting and is feeling better now.  See flowsheet for EFM.  I spoke with Dr. Julien Girt of [facility] who states the patient is obstetrically cleared.  She recommends patient to follow up with a pulmonologist.  RROB to pass this recommendation on to EDP.

## 2013-05-09 NOTE — ED Provider Notes (Signed)
CSN: [id number]     Arrival date & time 05/09/13  0867 History   First MD Initiated Contact with Patient 05/09/13 [phone]     Chief Complaint  Patient presents with  . Asthma     (Consider location/radiation/quality/duration/timing/severity/associated sxs/prior Treatment) HPI Comments: Patient presents to the ER for evaluation of wheezing and difficulty breathing. Patient reports a history of asthma as a child, but during her current pregnancy she has started having increased allergic symptoms. She has had intermittent episodes of wheezing and is pending an appointment with an allergist. Patient reports that she is [redacted] weeks pregnant. She had an attack last night, but eventually improved without treatment. She was able to get to sleep, but upon awakening this morning was having increased wheezing and difficulty breathing once again. She has had nonproductive cough, but no known fever.  Patient is a 28 y.o. female presenting with asthma.  Asthma Associated symptoms include shortness of breath.    Past Medical History  Diagnosis Date  . Anxiety   . Stomach ulcer     in college  . Ovarian cyst   . Asthma    History reviewed. No pertinent past surgical history. Family History  Problem Relation Age of Onset  . Diabetes Mother   . Diabetes Other   . Heart attack Other   . Alcohol abuse Father   . Heart disease Father   . Hypertension Father   . Mental illness Father   . Cancer Maternal Grandmother     breast  . Hearing loss Paternal Grandfather     MI - 37   . Hypertension Paternal Grandfather   . Heart disease Paternal Grandfather    History  Substance Use Topics  . Smoking status: Current Every Day Smoker -- 0.25 packs/day    Types: Cigarettes  . Smokeless tobacco: Not on file  . Alcohol Use: Yes     Comment: rare   OB History   Grav Para Term Preterm Abortions TAB SAB Ect Mult Living   1              Review of Systems  Respiratory: Positive for shortness of breath and  wheezing.   All other systems reviewed and are negative.      Allergies  Dilaudid and Sulfa antibiotics  Home Medications   Current Outpatient Rx  Name  Route  Sig  Dispense  Refill  . diphenhydrAMINE (BENADRYL) 25 mg capsule   Oral   Take 25 mg by mouth every 6 (six) hours as needed.         . Prenatal Vit-Fe Fumarate-FA (PRENATAL MULTIVITAMIN) TABS tablet   Oral   Take 1 tablet by mouth daily at 12 noon.         Marland Kitchen albuterol (PROVENTIL HFA;VENTOLIN HFA) 108 (90 BASE) MCG/ACT inhaler   Inhalation   Inhale 2 puffs into the lungs every 4 (four) hours as needed for wheezing or shortness of breath.   1 Inhaler   2   . azithromycin (ZITHROMAX) 250 MG tablet   Oral   Take 1 tablet (250 mg total) by mouth daily. Take first 2 tablets together, then 1 every day until finished.   6 tablet   0   . predniSONE (DELTASONE) 20 MG tablet   Oral   Take 2 tablets (40 mg total) by mouth daily with breakfast.   10 tablet   0    BP 103/58  Pulse 89  Temp(Src) 97.9 F (36.6 C) (Oral)  Resp  19  SpO2 100%  LMP 11/08/2012 Physical Exam  Constitutional: She is oriented to person, place, and time. She appears well-developed and well-nourished. No distress.  HENT:  Head: Normocephalic and atraumatic.  Right Ear: Hearing normal.  Left Ear: Hearing normal.  Nose: Nose normal.  Mouth/Throat: Oropharynx is clear and moist and mucous membranes are normal.  Eyes: Conjunctivae and EOM are normal. Pupils are equal, round, and reactive to light.  Neck: Normal range of motion. Neck supple.  Cardiovascular: Regular rhythm, S1 normal and S2 normal.  Exam reveals no gallop and no friction rub.   No murmur heard. Pulmonary/Chest: Accessory muscle usage present. No respiratory distress. She has wheezes. She exhibits no tenderness.  Abdominal: Soft. Normal appearance and bowel sounds are normal. There is no hepatosplenomegaly. There is no tenderness. There is no rebound, no guarding, no  tenderness at McBurney's point and negative Murphy's sign. No hernia.  Musculoskeletal: Normal range of motion.  Neurological: She is alert and oriented to person, place, and time. She has normal strength. No cranial nerve deficit or sensory deficit. Coordination normal. GCS eye subscore is 4. GCS verbal subscore is 5. GCS motor subscore is 6.  Skin: Skin is warm, dry and intact. No rash noted. No cyanosis.  Psychiatric: She has a normal mood and affect. Her speech is normal and behavior is normal. Thought content normal.    ED Course  Procedures (including critical care time) Labs Review Labs Reviewed - No data to display Imaging Review No results found.   EKG Interpretation None      MDM   Final diagnoses:  Asthma attack  Pregnancy    The patient presents to the ER with acute bronchospasm secondary to asthma. She has had recurrence of her asthma with this current pregnancy. She is [redacted] weeks pregnant. Will be as evaluated the baby by monitor, no concerns for obstetric complications. She has no complaints that would be concerning for obstetric complications.  Patient has had significant improvement with albuterol and Atrovent. Repeat examination reveals no further wheezing. She is breathing comfortably with normal oxygenation. Patient will therefore be maintained as an outpatient with further albuterol and prednisone. Add Zithromax.    Orpah Greek, MD 05/09/13 [phone]

## 2013-05-09 NOTE — ED Notes (Signed)
Pt states that last night she had an asthma attack but got better long enough for her to sleep. Then this morning she woke with shob. Pt is [redacted] weeks pregnant.

## 2013-05-09 NOTE — ED Notes (Signed)
OB rapid response RN at bedside. 

## 2013-05-09 NOTE — Discharge Instructions (Signed)
Asthma, Adult Asthma is a recurring condition in which the airways tighten and narrow. Asthma can make it difficult to breathe. It can cause coughing, wheezing, and shortness of breath. Asthma episodes (also called asthma attacks) range from minor to life-threatening. Asthma cannot be cured, but medicines and lifestyle changes can help control it. CAUSES Asthma is believed to be caused by inherited (genetic) and environmental factors, but its exact cause is unknown. Asthma may be triggered by allergens, lung infections, or irritants in the air. Asthma triggers are different for each person. Common triggers include:   Animal dander.  Dust mites.  Cockroaches.  Pollen from trees or grass.  Mold.  Smoke.  Air pollutants such as dust, household cleaners, hair sprays, aerosol sprays, paint fumes, strong chemicals, or strong odors.  Cold air, weather changes, and winds (which increase molds and pollens in the air).  Strong emotional expressions such as crying or laughing hard.  Stress.  Certain medicines (such as aspirin) or types of drugs (such as beta-blockers).  Sulfites in foods and drinks. Foods and drinks that may contain sulfites include dried fruit, potato chips, and sparkling grape juice.  Infections or inflammatory conditions such as the flu, a cold, or an inflammation of the nasal membranes (rhinitis).  Gastroesophageal reflux disease (GERD).  Exercise or strenuous activity. SYMPTOMS Symptoms may occur immediately after asthma is triggered or many hours later. Symptoms include:  Wheezing.  Excessive nighttime or early morning coughing.  Frequent or severe coughing with a common cold.  Chest tightness.  Shortness of breath. DIAGNOSIS  The diagnosis of asthma is made by a review of your medical history and a physical exam. Tests may also be performed. These may include:  Lung function studies. These tests show how much air you breath in and out.  Allergy  tests.  Imaging tests such as X-rays. TREATMENT  Asthma cannot be cured, but it can usually be controlled. Treatment involves identifying and avoiding your asthma triggers. It also involves medicines. There are 2 classes of medicine used for asthma treatment:   Controller medicines. These prevent asthma symptoms from occurring. They are usually taken every day.  Reliever or rescue medicines. These quickly relieve asthma symptoms. They are used as needed and provide short-term relief. Your health care provider will help you create an asthma action plan. An asthma action plan is a written plan for managing and treating your asthma attacks. It includes a list of your asthma triggers and how they may be avoided. It also includes information on when medicines should be taken and when their dosage should be changed. An action plan may also involve the use of a device called a peak flow meter. A peak flow meter measures how well the lungs are working. It helps you monitor your condition. HOME CARE INSTRUCTIONS   Take medicine as directed by your health care provider. Speak with your health care provider if you have questions about how or when to take the medicines.  Use a peak flow meter as directed by your health care provider. Record and keep track of readings.  Understand and use the action plan to help minimize or stop an asthma attack without needing to seek medical care.  Control your home environment in the following ways to help prevent asthma attacks:  Do not smoke. Avoid being exposed to secondhand smoke.  Change your heating and air conditioning filter regularly.  Limit your use of fireplaces and wood stoves.  Get rid of pests (such as roaches and   mice) and their droppings.  Throw away plants if you see mold on them.  Clean your floors and dust regularly. Use unscented cleaning products.  Try to have someone else vacuum for you regularly. Stay out of rooms while they are being  vacuumed and for a short while afterward. If you vacuum, use a dust mask from a hardware store, a double-layered or microfilter vacuum cleaner bag, or a vacuum cleaner with a HEPA filter.  Replace carpet with wood, tile, or vinyl flooring. Carpet can trap dander and dust.  Use allergy-proof pillows, mattress covers, and box spring covers.  Wash bed sheets and blankets every week in hot water and dry them in a dryer.  Use blankets that are made of polyester or cotton.  Clean bathrooms and kitchens with bleach. If possible, have someone repaint the walls in these rooms with mold-resistant paint. Keep out of the rooms that are being cleaned and painted.  Wash hands frequently. SEEK MEDICAL CARE IF:   You have wheezing, shortness of breath, or a cough even if taking medicine to prevent attacks.  The colored mucus you cough up (sputum) is thicker than usual.  Your sputum changes from clear or white to yellow, green, gray, or bloody.  You have any problems that may be related to the medicines you are taking (such as a rash, itching, swelling, or trouble breathing).  You are using a reliever medicine more than 2 3 times per week.  Your peak flow is still at 50 79% of you personal best after following your action plan for 1 hour. SEEK IMMEDIATE MEDICAL CARE IF:   You seem to be getting worse and are unresponsive to treatment during an asthma attack.  You are short of breath even at rest.  You get short of breath when doing very little physical activity.  You have difficulty eating, drinking, or talking due to asthma symptoms.  You develop chest pain.  You develop a fast heartbeat.  You have a bluish color to your lips or fingernails.  You are lightheaded, dizzy, or faint.  Your peak flow is less than 50% of your personal best.  You have a fever or persistent symptoms for more than 2 3 days.  You have a fever and symptoms suddenly get worse. MAKE SURE YOU:   Understand these  instructions.  Will watch your condition.  Will get help right away if you are not doing well or get worse. Document Released: 02/24/2005 Document Revised: 10/27/2012 Document Reviewed: 09/23/2012 [employer] Patient Information 2014 [employer].  

## 2013-06-30 ENCOUNTER — Inpatient Hospital Stay (HOSPITAL_COMMUNITY)
Admission: AD | Admit: 2013-06-30 | Discharge: 2013-06-30 | Disposition: A | Discharge status: Home / Self Care | Payer: Managed Care, Other (non HMO) | Source: Ambulatory Visit | Attending: Obstetrics and Gynecology | Admitting: Obstetrics and Gynecology

## 2013-06-30 ENCOUNTER — Encounter (HOSPITAL_COMMUNITY): Payer: Self-pay

## 2013-06-30 DIAGNOSIS — B9789 Other viral agents as the cause of diseases classified elsewhere: Secondary | ICD-10-CM

## 2013-06-30 DIAGNOSIS — R059 Cough, unspecified: Secondary | ICD-10-CM | POA: Insufficient documentation

## 2013-06-30 DIAGNOSIS — J069 Acute upper respiratory infection, unspecified: Secondary | ICD-10-CM | POA: Insufficient documentation

## 2013-06-30 DIAGNOSIS — O9989 Other specified diseases and conditions complicating pregnancy, childbirth and the puerperium: Principal | ICD-10-CM

## 2013-06-30 DIAGNOSIS — F17200 Nicotine dependence, unspecified, uncomplicated: Secondary | ICD-10-CM | POA: Insufficient documentation

## 2013-06-30 DIAGNOSIS — R05 Cough: Secondary | ICD-10-CM | POA: Insufficient documentation

## 2013-06-30 DIAGNOSIS — R079 Chest pain, unspecified: Secondary | ICD-10-CM | POA: Insufficient documentation

## 2013-06-30 DIAGNOSIS — R0781 Pleurodynia: Secondary | ICD-10-CM

## 2013-06-30 DIAGNOSIS — R0602 Shortness of breath: Secondary | ICD-10-CM | POA: Insufficient documentation

## 2013-06-30 DIAGNOSIS — O99891 Other specified diseases and conditions complicating pregnancy: Secondary | ICD-10-CM | POA: Insufficient documentation

## 2013-06-30 HISTORY — DX: Renal tubulo-interstitial disease, unspecified: N15.9

## 2013-06-30 NOTE — Discharge Instructions (Signed)
Rib Fracture  A rib fracture is a break or crack in one of the bones of the ribs. The ribs are a group of long, curved bones that wrap around your chest and attach to your spine. They protect your lungs and other organs in the chest cavity. A broken or cracked rib is often painful, but most do not cause other problems. Most rib fractures heal on their own over time. However, rib fractures can be more serious if multiple ribs are broken or if broken ribs move out of place and push against other structures.  CAUSES   · A direct blow to the chest. For example, this could happen during contact sports, a car accident, or a fall against a hard object.  · Repetitive movements with high force, such as pitching a baseball or having severe coughing spells.  SYMPTOMS   · Pain when you breathe in or cough.  · Pain when someone presses on the injured area.  DIAGNOSIS   Your caregiver will perform a physical exam. Various imaging tests may be ordered to confirm the diagnosis and to look for related injuries. These tests may include a chest X-ray, computed tomography (CT), magnetic resonance imaging (MRI), or a bone scan.  TREATMENT   Rib fractures usually heal on their own in 1 3 months. The longer healing period is often associated with a continued cough or other aggravating activities. During the healing period, pain control is very important. Medication is usually given to control pain. Hospitalization or surgery may be needed for more severe injuries, such as those in which multiple ribs are broken or the ribs have moved out of place.   HOME CARE INSTRUCTIONS   · Avoid strenuous activity and any activities or movements that cause pain. Be careful during activities and avoid bumping the injured rib.  · Gradually increase activity as directed by your caregiver.  · Only take over-the-counter or prescription medications as directed by your caregiver. Do not take other medications without asking your caregiver first.  · Apply ice  to the injured area for the first 1 2 days after you have been treated or as directed by your caregiver. Applying ice helps to reduce inflammation and pain.  · Put ice in a plastic bag.  · Place a towel between your skin and the bag.    · Leave the ice on for 15 20 minutes at a time, every 2 hours while you are awake.  · Perform deep breathing as directed by your caregiver. This will help prevent pneumonia, which is a common complication of a broken rib. Your caregiver may instruct you to:  · Take deep breaths several times a day.  · Try to cough several times a day, holding a pillow against the injured area.  · Use a device called an incentive spirometer to practice deep breathing several times a day.  · Drink enough fluids to keep your urine clear or pale yellow. This will help you avoid constipation.    · Do not wear a rib belt or binder. These restrict breathing, which can lead to pneumonia.    SEEK IMMEDIATE MEDICAL CARE IF:   · You have a fever.    · You have difficulty breathing or shortness of breath.    · You develop a continual cough, or you cough up thick or bloody sputum.  · You feel sick to your stomach (nausea), throw up (vomit), or have abdominal pain.    · You have worsening pain not controlled with medications.      MAKE SURE YOU:  · Understand these instructions.  · Will watch your condition.  · Will get help right away if you are not doing well or get worse.  Document Released: 02/24/2005 Document Revised: 10/27/2012 Document Reviewed: 04/28/2012  [employer]® Patient Information ©2014 [employer].

## 2013-06-30 NOTE — MAU Note (Signed)
Pt states has had cough/chest congestion/SOB x 1 week. Was coughing today and felt/heard a pop, thinks she cracked a rib on the right side. Denies OB complications or complaints at this time.

## 2013-06-30 NOTE — MAU Provider Note (Signed)
Chief Complaint:  Cough, Shortness of Breath and Chest Pain    First Provider Initiated Contact with Patient 06/30/13 [phone]     HPI: Deanna Norton is a 28 y.o. G1P0 at [redacted]w[redacted]d who presents to maternity admissions reporting possible broken rib and difficulty breathing. States she's had an upper respiratory infection an increase in asthma symptoms x1 week. Frequent coughing. Cough very hard tonight and felt a pop in her rib cage along her right side. Since then it has been extremely painful to cough or breathe. The patient states that main reason she is having difficulty breathing is because she is guarding from the rib pain. Has been taking Tylenol and icing the area frequently with adequate relief. Pain is now down to 7/10 on pain scale.  Denies contractions, leakage of fluid or vaginal bleeding. Good fetal movement.   Past Medical History: Past Medical History  Diagnosis Date  . Anxiety   . Stomach ulcer     in college  . Ovarian cyst   . Asthma     Past obstetric history: OB History  Gravida Para Term Preterm AB SAB TAB Ectopic Multiple Living  1             # Outcome Date GA Lbr Len/2nd Weight Sex Delivery Anes PTL Lv  1 CUR               Past Surgical History: No past surgical history on file.   Family History: Family History  Problem Relation Age of Onset  . Diabetes Mother   . Diabetes Other   . Heart attack Other   . Alcohol abuse Father   . Heart disease Father   . Hypertension Father   . Mental illness Father   . Cancer Maternal Grandmother     breast  . Hearing loss Paternal Grandfather     MI - 59   . Hypertension Paternal Grandfather   . Heart disease Paternal Grandfather     Social History: History  Substance Use Topics  . Smoking status: Current Every Day Smoker -- 0.25 packs/day    Types: Cigarettes  . Smokeless tobacco: Not on file  . Alcohol Use: Yes     Comment: rare    Allergies:  Allergies  Allergen Reactions  . Dilaudid [Hydromorphone  Hcl] Shortness Of Breath    IV dilaudid causes nerve & muscle burning  . Sulfa Antibiotics Hives    Meds:  Prescriptions prior to admission  Medication Sig Dispense Refill  . albuterol (PROVENTIL HFA;VENTOLIN HFA) 108 (90 BASE) MCG/ACT inhaler Inhale 2 puffs into the lungs every 4 (four) hours as needed for wheezing or shortness of breath.  1 Inhaler  2  . diphenhydrAMINE (BENADRYL) 25 mg capsule Take 25 mg by mouth every 6 (six) hours as needed.      . Prenatal Vit-Fe Fumarate-FA (PRENATAL MULTIVITAMIN) TABS tablet Take 1 tablet by mouth daily at 12 noon.        ROS: Pertinent findings in history of present illness. Slight wheezing. Negative for fever, chills, chest pain, palpitations.  Physical Exam  Blood pressure 129/80, pulse 92, temperature 97.8 F (36.6 C), temperature source Oral, resp. rate 24, last menstrual period 10/27/2012. Patient Vitals for the past 24 hrs:  BP Temp Temp src Pulse Resp SpO2  06/30/13 0755 120/80 mmHg 97.8 F (36.6 C) Oral 110 20 100 %    GENERAL: Well-developed, well-nourished female in mild distress at rest moderate distress with movement and coughing.  no cyanosis.  HEENT: normocephalic HEART: normal rate and rhythm no murmurs rubs or gallops.  RESP: normal effort. Shallow breathing. Slight wheezing. ABDOMEN: Soft, non-tender, gravid appropriate for gestational age. Severe point tenderness on rib right midaxillary line. No bruising, swelling, mass or crepitus. EXTREMITIES: Nontender, no edema NEURO: alert and oriented SPECULUM EXAM: Deferred.   FHT:  Baseline 130 , moderate variability, accelerations present, no decelerations Contractions: None   Labs: No results found for this or any previous visit (from the past 24 hour(s)).  Imaging:  N/A  MAU Course: Chest x-ray ordered per consult with Dr. Radene Knee. Patient declined. States she is reassured that her oxygen level is normal and her baby is in no danger. He is concerned about radiation  exposure or. Explained negligible risk in third trimester, but also explained that there is limited value to confirming the fracture since treatment is the same either way--pain management and encouraging deep breathing to decrease risk of pneumonia.  Assessment: 1. Rib pain on right side   2. Viral URI with cough   3. Other current maternal conditions classifiable elsewhere, antepartum    Plan: Discharge home in stable condition. Preterm labor precautions and fetal kick counts. Comfort measures. Encourage deep breathing and instructed patient on splinting the area and she anticipates cough or movement. Also encourage patient to take allergy medications on schedule to hopefully decrease cough triggers. Patient has cough medicine with codeine at home. Follow-up Information   Follow up with [facility] [place].. (As scheduled or, As needed, If symptoms worsen)    Contact information:   [place] [phone] [phone]      Follow up with [facility]. (As needed, If symptoms worsen)    Contact information:   [address] [id number] [place] [address] [phone]        Medication List    ASK your doctor about these medications       albuterol 108 (90 BASE) MCG/ACT inhaler  Commonly known as:  PROVENTIL HFA;VENTOLIN HFA  Inhale 2 puffs into the lungs every 4 (four) hours as needed for wheezing or shortness of breath.     diphenhydrAMINE 25 mg capsule  Commonly known as:  BENADRYL  Take 25 mg by mouth every 6 (six) hours as needed.     prenatal multivitamin Tabs tablet  Take 1 tablet by mouth daily at 12 noon.        [place], CNM 06/30/2013 6:18 AM

## 2013-08-02 LAB — OB RESULTS CONSOLE GBS: STREP GROUP B AG: POSITIVE

## 2013-08-19 ENCOUNTER — Encounter (HOSPITAL_COMMUNITY): Payer: Self-pay

## 2013-08-19 ENCOUNTER — Inpatient Hospital Stay (HOSPITAL_COMMUNITY)
Admission: AD | Admit: 2013-08-19 | Discharge: 2013-08-23 | DRG: 775 | Disposition: A | Discharge status: Home / Self Care | Payer: Managed Care, Other (non HMO) | Source: Ambulatory Visit | Attending: Obstetrics and Gynecology | Admitting: Obstetrics and Gynecology

## 2013-08-19 DIAGNOSIS — Z833 Family history of diabetes mellitus: Secondary | ICD-10-CM

## 2013-08-19 DIAGNOSIS — O9989 Other specified diseases and conditions complicating pregnancy, childbirth and the puerperium: Secondary | ICD-10-CM

## 2013-08-19 DIAGNOSIS — Z8249 Family history of ischemic heart disease and other diseases of the circulatory system: Secondary | ICD-10-CM

## 2013-08-19 DIAGNOSIS — Z87891 Personal history of nicotine dependence: Secondary | ICD-10-CM

## 2013-08-19 DIAGNOSIS — Z22330 Carrier of Group B streptococcus: Secondary | ICD-10-CM

## 2013-08-19 DIAGNOSIS — O99892 Other specified diseases and conditions complicating childbirth: Secondary | ICD-10-CM | POA: Diagnosis present

## 2013-08-19 HISTORY — DX: Enterocolitis due to Clostridium difficile, not specified as recurrent: A04.72

## 2013-08-19 LAB — URINE MICROSCOPIC-ADD ON

## 2013-08-19 LAB — URINALYSIS, ROUTINE W REFLEX MICROSCOPIC
BILIRUBIN URINE: NEGATIVE
Glucose, UA: NEGATIVE mg/dL
KETONES UR: NEGATIVE mg/dL
LEUKOCYTES UA: NEGATIVE
NITRITE: NEGATIVE
Protein, ur: NEGATIVE mg/dL
SPECIFIC GRAVITY, URINE: 1.01 (ref 1.005–1.030)
UROBILINOGEN UA: 0.2 mg/dL (ref 0.0–1.0)
pH: 6 (ref 5.0–8.0)

## 2013-08-19 NOTE — MAU Note (Signed)
Leaking fld off and on since 1800. Fld is clear. Some tightening in stomach about 42mins apart but  Nothing painful.

## 2013-08-20 ENCOUNTER — Encounter (HOSPITAL_COMMUNITY): Payer: Managed Care, Other (non HMO) | Admitting: Anesthesiology

## 2013-08-20 ENCOUNTER — Encounter (HOSPITAL_COMMUNITY): Payer: Self-pay | Admitting: General Practice

## 2013-08-20 ENCOUNTER — Inpatient Hospital Stay (HOSPITAL_COMMUNITY): Payer: Managed Care, Other (non HMO) | Admitting: Anesthesiology

## 2013-08-20 LAB — CBC
HCT: 39.4 % (ref 36.0–46.0)
HEMOGLOBIN: 13.3 g/dL (ref 12.0–15.0)
MCH: 30 pg (ref 26.0–34.0)
MCHC: 33.8 g/dL (ref 30.0–36.0)
MCV: 88.9 fL (ref 78.0–100.0)
Platelets: 249 10*3/uL (ref 150–400)
RBC: 4.43 MIL/uL (ref 3.87–5.11)
RDW: 13.4 % (ref 11.5–15.5)
WBC: 9.8 10*3/uL (ref 4.0–10.5)

## 2013-08-20 LAB — TYPE AND SCREEN
ABO/RH(D): A POS
Antibody Screen: NEGATIVE

## 2013-08-20 LAB — ABO/RH: ABO/RH(D): A POS

## 2013-08-20 LAB — RPR

## 2013-08-20 MED ORDER — LACTATED RINGERS IV SOLN
500.0000 mL | Freq: Once | INTRAVENOUS | Status: AC
  Administered 2013-08-20: 500 mL via INTRAVENOUS

## 2013-08-20 MED ORDER — ONDANSETRON HCL 4 MG/2ML IJ SOLN
4.0000 mg | Freq: Four times a day (QID) | INTRAMUSCULAR | Status: DC | PRN
  Filled 2013-08-20: qty 2

## 2013-08-20 MED ORDER — FENTANYL 2.5 MCG/ML BUPIVACAINE 1/10 % EPIDURAL INFUSION (WH - ANES)
INTRAMUSCULAR | Status: DC | PRN
  Administered 2013-08-20: 14 mL/h via EPIDURAL

## 2013-08-20 MED ORDER — IBUPROFEN 600 MG PO TABS
600.0000 mg | ORAL_TABLET | Freq: Four times a day (QID) | ORAL | Status: DC | PRN
  Filled 2013-08-20: qty 1

## 2013-08-20 MED ORDER — LACTATED RINGERS IV SOLN
500.0000 mL | INTRAVENOUS | Status: DC | PRN
Start: 2013-08-20 — End: 2013-08-21
  Administered 2013-08-20: 500 mL via INTRAVENOUS

## 2013-08-20 MED ORDER — OXYCODONE-ACETAMINOPHEN 5-325 MG PO TABS: 1.0000 | ORAL_TABLET | ORAL | Status: DC | PRN

## 2013-08-20 MED ORDER — PENICILLIN G POTASSIUM 5000000 UNITS IJ SOLR
2.5000 10*6.[IU] | INTRAVENOUS | Status: DC
  Administered 2013-08-20 (×5): 2.5 10*6.[IU] via INTRAVENOUS
  Filled 2013-08-20 (×10): qty 2.5

## 2013-08-20 MED ORDER — LACTATED RINGERS IV SOLN
INTRAVENOUS | Status: DC
  Administered 2013-08-20 (×3): via INTRAVENOUS

## 2013-08-20 MED ORDER — FLEET ENEMA 7-19 GM/118ML RE ENEM: 1.0000 | ENEMA | RECTAL | Status: DC | PRN

## 2013-08-20 MED ORDER — PHENYLEPHRINE 40 MCG/ML (10ML) SYRINGE FOR IV PUSH (FOR BLOOD PRESSURE SUPPORT)
80.0000 ug | PREFILLED_SYRINGE | INTRAVENOUS | Status: DC | PRN
  Filled 2013-08-20: qty 2
  Filled 2013-08-20: qty 10

## 2013-08-20 MED ORDER — EPHEDRINE 5 MG/ML INJ
10.0000 mg | INTRAVENOUS | Status: DC | PRN
  Filled 2013-08-20: qty 2

## 2013-08-20 MED ORDER — LIDOCAINE HCL (PF) 1 % IJ SOLN
30.0000 mL | INTRAMUSCULAR | Status: DC | PRN
  Administered 2013-08-21: 30 mL via SUBCUTANEOUS
  Filled 2013-08-20: qty 30

## 2013-08-20 MED ORDER — FENTANYL 2.5 MCG/ML BUPIVACAINE 1/10 % EPIDURAL INFUSION (WH - ANES)
14.0000 mL/h | INTRAMUSCULAR | Status: DC | PRN
  Filled 2013-08-20: qty 125

## 2013-08-20 MED ORDER — DIPHENHYDRAMINE HCL 50 MG/ML IJ SOLN: 12.5000 mg | INTRAMUSCULAR | Status: DC | PRN

## 2013-08-20 MED ORDER — LIDOCAINE HCL (PF) 1 % IJ SOLN
INTRAMUSCULAR | Status: DC | PRN
  Administered 2013-08-20 (×2): 9 mL

## 2013-08-20 MED ORDER — EPHEDRINE 5 MG/ML INJ
10.0000 mg | INTRAVENOUS | Status: DC | PRN
  Filled 2013-08-20: qty 2
  Filled 2013-08-20: qty 4

## 2013-08-20 MED ORDER — OXYTOCIN BOLUS FROM INFUSION
500.0000 mL | INTRAVENOUS | Status: DC
  Administered 2013-08-21: 500 mL via INTRAVENOUS

## 2013-08-20 MED ORDER — OXYTOCIN 40 UNITS IN LACTATED RINGERS INFUSION - SIMPLE MED: 62.5000 mL/h | INTRAVENOUS | Status: DC

## 2013-08-20 MED ORDER — OXYTOCIN 40 UNITS IN LACTATED RINGERS INFUSION - SIMPLE MED
1.0000 m[IU]/min | INTRAVENOUS | Status: DC
  Administered 2013-08-20: 2 m[IU]/min via INTRAVENOUS
  Filled 2013-08-20: qty 1000

## 2013-08-20 MED ORDER — ACETAMINOPHEN 325 MG PO TABS: 650.0000 mg | ORAL_TABLET | ORAL | Status: DC | PRN

## 2013-08-20 MED ORDER — PHENYLEPHRINE 40 MCG/ML (10ML) SYRINGE FOR IV PUSH (FOR BLOOD PRESSURE SUPPORT)
80.0000 ug | PREFILLED_SYRINGE | INTRAVENOUS | Status: DC | PRN
  Administered 2013-08-20: 80 ug via INTRAVENOUS
  Filled 2013-08-20: qty 2

## 2013-08-20 MED ORDER — TERBUTALINE SULFATE 1 MG/ML IJ SOLN: 0.2500 mg | Freq: Once | INTRAMUSCULAR | Status: AC | PRN

## 2013-08-20 MED ORDER — PENICILLIN G POTASSIUM 5000000 UNITS IJ SOLR
5.0000 10*6.[IU] | Freq: Once | INTRAVENOUS | Status: AC
  Administered 2013-08-20: 5 10*6.[IU] via INTRAVENOUS
  Filled 2013-08-20: qty 5

## 2013-08-20 MED ORDER — FENTANYL CITRATE 0.05 MG/ML IJ SOLN
50.0000 ug | INTRAMUSCULAR | Status: DC | PRN
  Administered 2013-08-20 (×6): 50 ug via INTRAVENOUS
  Filled 2013-08-20 (×6): qty 2

## 2013-08-20 MED ORDER — CITRIC ACID-SODIUM CITRATE 334-500 MG/5ML PO SOLN: 30.0000 mL | ORAL | Status: DC | PRN

## 2013-08-20 NOTE — H&P (Signed)
Deanna Norton is a 28 y.o. female presenting for SROM. Maternal Medical History:  Reason for admission: Rupture of membranes.   Contractions: Onset was 6-12 hours ago.   Frequency: irregular.   Perceived severity is mild.    Fetal activity: Perceived fetal activity is normal.   Last perceived fetal movement was within the past hour.      OB History   Grav Para Term Preterm Abortions TAB SAB Ect Mult Living   1 0 0 0 0 0 0 0 0 0      Past Medical History  Diagnosis Date  . Anxiety   . Stomach ulcer     in college  . Ovarian cyst   . Asthma   . Kidney infection 07/27/12  . C. difficile diarrhea 2014    Completed Flagyl   Past Surgical History  Procedure Laterality Date  . No past surgeries     Family History: family history includes Alcohol abuse in her father; Cancer in her maternal grandmother; Diabetes in her mother and other; Hearing loss in her paternal grandfather; Heart attack in her other; Heart disease in her father and paternal grandfather; Hypertension in her father and paternal grandfather; Mental illness in her father. Social History:  reports that she quit smoking about 9 months ago. Her smoking use included Cigarettes. She smoked 0.25 packs per day. She has never used smokeless tobacco. She reports that she does not drink alcohol or use illicit drugs.   Prenatal Transfer Tool  Maternal Diabetes: No Genetic Screening: Normal Maternal Ultrasounds/Referrals: Normal Fetal Ultrasounds or other Referrals:  None Maternal Substance Abuse:  No Significant Maternal Medications:  None Significant Maternal Lab Results:  None Other Comments:  None  ROS  Dilation: Fingertip Effacement (%): 70 Station: -3;-2 Exam by:: [employer] RN Blood pressure 121/80, pulse 86, temperature 98 F (36.7 C), temperature source Oral, resp. rate 18, height 5\' 10"  (1.778 m), weight 258 lb (117.028 kg), last menstrual period 11/08/2012, SpO2 99.00%. Exam Physical Exam   Constitutional: She is oriented to person, place, and time. She appears well-developed and well-nourished.  HENT:  Head: Atraumatic.  Neck: Normal range of motion. Neck supple.  Cardiovascular: Normal rate and regular rhythm.   Respiratory: Effort normal and breath sounds normal.  GI:  Term FH FHR 148  Genitourinary:  Clear AF/vtx  Musculoskeletal: Normal range of motion.  Neurological: She is alert and oriented to person, place, and time.    Prenatal labs: ABO, Rh: --/--/A POS, A POS (06/12 2345) Antibody: NEG (06/12 2345) Rubella: Immune (11/12 0000) RPR: NON REAC (06/12 2345)  HBsAg: Negative (11/12 0000)  HIV: Non-reactive (11/12 0000)  GBS: Positive (05/26 0000)   Assessment/Plan: Term IUP/SROM, IV PCN for + GBS   Deanna Norton M 08/20/2013, 7:57 AM

## 2013-08-20 NOTE — Anesthesia Procedure Notes (Signed)
Epidural Patient location during procedure: OB Start time: 08/20/2013 9:58 PM End time: 08/20/2013 10:02 PM  Staffing Anesthesiologist: Lyn Hollingshead Performed by: anesthesiologist   Preanesthetic Checklist Completed: patient identified, surgical consent, pre-op evaluation, timeout performed, IV checked, risks and benefits discussed and monitors and equipment checked  Epidural Patient position: sitting Prep: site prepped and draped and DuraPrep Patient monitoring: continuous pulse ox and blood pressure Approach: midline Location: L3-L4 Injection technique: LOR air  Needle:  Needle type: Tuohy  Needle gauge: 17 G Needle length: 9 cm and 9 Needle insertion depth: 9 cm Catheter type: closed end flexible Catheter size: 19 Gauge Catheter at skin depth: 14 cm Test dose: negative  Assessment Sensory level: T8 Events: blood not aspirated, injection not painful, no injection resistance, negative IV test and no paresthesia  Additional Notes Reason for block:procedure for pain

## 2013-08-20 NOTE — Progress Notes (Signed)
On 8 mu/min pit, ctx q 3 min, cx tight 1` cm, requesting IV pain med. FHR cat 1

## 2013-08-20 NOTE — Progress Notes (Signed)
Patient upset because she was not told that she is going to get Penicillin every 4 hours . She feels very restricted and none of her doctors explained it to her. Called Dr. Matthew Saras to let him know.

## 2013-08-20 NOTE — Anesthesia Preprocedure Evaluation (Signed)
Anesthesia Evaluation  Patient identified by MRN, date of birth, ID band Patient awake    Reviewed: Allergy & Precautions, H&P , NPO status , Patient's Chart, lab work & pertinent test results  Airway Mallampati: II TM Distance: >3 FB Neck ROM: full    Dental no notable dental hx.    Pulmonary former smoker,    Pulmonary exam normal       Cardiovascular negative cardio ROS      Neuro/Psych negative neurological ROS     GI/Hepatic Neg liver ROS, PUD,   Endo/Other  negative endocrine ROS  Renal/GU      Musculoskeletal   Abdominal (+) + obese,   Peds  Hematology negative hematology ROS (+)   Anesthesia Other Findings   Reproductive/Obstetrics (+) Pregnancy                           Anesthesia Physical Anesthesia Plan  ASA: II  Anesthesia Plan: Epidural   Post-op Pain Management:    Induction:   Airway Management Planned:   Additional Equipment:   Intra-op Plan:   Post-operative Plan:   Informed Consent: I have reviewed the patients History and Physical, chart, labs and discussed the procedure including the risks, benefits and alternatives for the proposed anesthesia with the patient or authorized representative who has indicated his/her understanding and acceptance.     Plan Discussed with:   Anesthesia Plan Comments:         Anesthesia Quick Evaluation

## 2013-08-20 NOTE — Progress Notes (Signed)
cx closed/firm....SROM ~ 12 hrs now, on PCN for + GBS  Rec cytotec>>pt declines, will start pit aug per protocol FHR cat 1

## 2013-08-21 ENCOUNTER — Encounter (HOSPITAL_COMMUNITY): Payer: Self-pay | Admitting: Obstetrics

## 2013-08-21 LAB — CBC
HEMATOCRIT: 35.9 % — AB (ref 36.0–46.0)
Hemoglobin: 12.1 g/dL (ref 12.0–15.0)
MCH: 29.7 pg (ref 26.0–34.0)
MCHC: 33.7 g/dL (ref 30.0–36.0)
MCV: 88 fL (ref 78.0–100.0)
PLATELETS: 218 10*3/uL (ref 150–400)
RBC: 4.08 MIL/uL (ref 3.87–5.11)
RDW: 13.2 % (ref 11.5–15.5)
WBC: 12.8 10*3/uL — ABNORMAL HIGH (ref 4.0–10.5)

## 2013-08-21 MED ORDER — WITCH HAZEL-GLYCERIN EX PADS: 1.0000 "application " | MEDICATED_PAD | CUTANEOUS | Status: DC | PRN

## 2013-08-21 MED ORDER — FLEET ENEMA 7-19 GM/118ML RE ENEM: 1.0000 | ENEMA | Freq: Every day | RECTAL | Status: DC | PRN

## 2013-08-21 MED ORDER — OXYCODONE-ACETAMINOPHEN 5-325 MG PO TABS: 1.0000 | ORAL_TABLET | Freq: Four times a day (QID) | ORAL | Status: DC | PRN

## 2013-08-21 MED ORDER — DIBUCAINE 1 % RE OINT: 1.0000 "application " | TOPICAL_OINTMENT | RECTAL | Status: DC | PRN

## 2013-08-21 MED ORDER — BENZOCAINE-MENTHOL 20-0.5 % EX AERO
1.0000 "application " | INHALATION_SPRAY | CUTANEOUS | Status: DC | PRN
  Administered 2013-08-21: 1 via TOPICAL
  Filled 2013-08-21: qty 56

## 2013-08-21 MED ORDER — BISACODYL 10 MG RE SUPP: 10.0000 mg | Freq: Every day | RECTAL | Status: DC | PRN

## 2013-08-21 MED ORDER — MEASLES, MUMPS & RUBELLA VAC ~~LOC~~ INJ
0.5000 mL | INJECTION | Freq: Once | SUBCUTANEOUS | Status: DC
  Filled 2013-08-21: qty 0.5

## 2013-08-21 MED ORDER — LANOLIN HYDROUS EX OINT: TOPICAL_OINTMENT | CUTANEOUS | Status: DC | PRN

## 2013-08-21 MED ORDER — SIMETHICONE 80 MG PO CHEW: 80.0000 mg | CHEWABLE_TABLET | ORAL | Status: DC | PRN

## 2013-08-21 MED ORDER — IBUPROFEN 800 MG PO TABS
800.0000 mg | ORAL_TABLET | Freq: Three times a day (TID) | ORAL | Status: DC | PRN
  Administered 2013-08-21 – 2013-08-23 (×8): 800 mg via ORAL
  Filled 2013-08-21 (×8): qty 1

## 2013-08-21 MED ORDER — SENNOSIDES-DOCUSATE SODIUM 8.6-50 MG PO TABS
2.0000 | ORAL_TABLET | ORAL | Status: DC
  Administered 2013-08-23: 2 via ORAL
  Filled 2013-08-21: qty 2

## 2013-08-21 MED ORDER — ONDANSETRON HCL 4 MG PO TABS: 4.0000 mg | ORAL_TABLET | ORAL | Status: DC | PRN

## 2013-08-21 MED ORDER — DIPHENHYDRAMINE HCL 25 MG PO CAPS: 25.0000 mg | ORAL_CAPSULE | Freq: Four times a day (QID) | ORAL | Status: DC | PRN

## 2013-08-21 MED ORDER — ZOLPIDEM TARTRATE 5 MG PO TABS: 5.0000 mg | ORAL_TABLET | Freq: Every evening | ORAL | Status: DC | PRN

## 2013-08-21 MED ORDER — PRENATAL MULTIVITAMIN CH
1.0000 | ORAL_TABLET | Freq: Every day | ORAL | Status: DC
  Administered 2013-08-21 – 2013-08-22 (×2): 1 via ORAL
  Filled 2013-08-21 (×2): qty 1

## 2013-08-21 MED ORDER — TETANUS-DIPHTH-ACELL PERTUSSIS 5-2.5-18.5 LF-MCG/0.5 IM SUSP: 0.5000 mL | Freq: Once | INTRAMUSCULAR | Status: DC

## 2013-08-21 MED ORDER — ONDANSETRON HCL 4 MG/2ML IJ SOLN
4.0000 mg | INTRAMUSCULAR | Status: DC | PRN
  Administered 2013-08-21: 4 mg via INTRAVENOUS

## 2013-08-21 NOTE — Anesthesia Postprocedure Evaluation (Signed)
Anesthesia Post Note  Patient: Deanna Norton  Procedure(s) Performed: * No procedures listed *  Anesthesia type: Epidural  Patient location: Mother/Baby  Post pain: Pain level controlled  Post assessment: Post-op Vital signs reviewed  Last Vitals:  Filed Vitals:   08/21/13 0725  BP: 104/64  Pulse: 66  Temp: 36.9 C  Resp: 18    Post vital signs: Reviewed  Level of consciousness:alert  Complications: No apparent anesthesia complications

## 2013-08-21 NOTE — Lactation Note (Signed)
This note was copied from the chart of Deanna Aviela Blundell. Lactation Consultation Note    Initial aconsult qwith this mom and baby, now 74 hours old. Mom reports breast feeding going well, baby has voided once and stooled 3 times already. i assisted mom with positioning for football hold on right breast. The baby latches esiily, with strong, rhythmic suckles . I showed mom how to hand express, and she was pleased to see colostrum. Mom drinking Mother's milk tea to assure good milk supply. i told her it may not be neccesary, but is fine if she wants to. Basic breast feeding teaching done, and lactation sevices reviewed  Patient Name: Deanna Norton Today's Date: 08/21/2013 Reason for consult: Initial assessment   Maternal Data Formula Feeding for Exclusion: No Infant to breast within first hour of birth: Yes Has patient been taught Hand Expression?: Yes Does the patient have breastfeeding experience prior to this delivery?: No  Feeding Feeding Type: Breast Fed Length of feed: 0 min  LATCH Score/Interventions Latch: Grasps breast easily, tongue down, lips flanged, rhythmical sucking.  Audible Swallowing: A few with stimulation  Type of Nipple: Flat (semi flat - soft) Intervention(s): No intervention needed  Comfort (Breast/Nipple): Soft / non-tender     Hold (Positioning): Assistance needed to correctly position infant at breast and maintain latch. Intervention(s): Breastfeeding basics reviewed;Support Pillows;Position options;Skin to skin  LATCH Score: 7  Lactation Tools Discussed/Used     Consult Status Consult Status: Follow-up Date: 08/22/13 Follow-up type: In-patient    Deanna Norton 08/21/2013, 4:02 PM

## 2013-08-21 NOTE — Progress Notes (Signed)
Clinical Social Work Department PSYCHOSOCIAL ASSESSMENT - MATERNAL/CHILD 08/21/2013  Patient:  Deanna Norton, Deanna Norton  Account Number:  [id number]  Admit Date:  08/19/2013  Ardine Eng Name:   Doreene Nest [facility]    Clinical Social Worker:  Tykel Badie, LCSW   Date/Time:  08/21/2013 10:30 AM  Date Referred:  08/21/2013   Referral source  Central Nursery     Referred reason  Depression/Anxiety   Other referral source:    I:  FAMILY / Elbert legal guardian:  PARENT  Guardian - Name Guardian - Age Guardian - Address  [facility] [address] [place], [place] [address]  Erma Heritage     Other household support members/support persons Other support:    II  PSYCHOSOCIAL DATA Information Source:    Occupational hygienist Employment:   Financial resources:   If Medicaid - County:    School / Grade:   Maternity Care Coordinator / Child Services Coordination / Early Interventions:  Cultural issues impacting care:    III  STRENGTHS Strengths  Supportive family/friends  Home prepared for Child (including basic supplies)  Adequate Resources   Strength comment:    IV  RISK FACTORS AND CURRENT PROBLEMS Current Problem:       V  SOCIAL WORK ASSESSMENT Acknowledged order for Social Work consult to assess mother's history of anxiety and panic attacks.  Parents are married and have no other dependents.  Mother state that she has hx of panic attacks and anxiety.  Informed that she has been symptom free for the past 2.5 years, but had a panic attack yesterday.   Mother states that she believes it was brought on because of the fear of the unknown.  This is her first child, and she did not know what to expect. She admits to feeling nervous, but she's also very excited. Informed that she was on a low dose of buspar and has not taken any medication since 2011.  Mother states that she was in therapy and learned self-calming strategies which she reports have been  effective.  She denies any hx of psychiatric hospitalization.  Discussed signs and symptoms of PP Depression.  Mother is aware of where to get help if needed.   Provided supportive feedback. She reports extensive family support.  No acute social concerns related at this time.    Mother informed of social work Fish farm manager.      VI SOCIAL WORK PLAN Social Work Plan  No Further Intervention Required / No Barriers to Discharge

## 2013-08-21 NOTE — Progress Notes (Signed)
Post Partum Day DOD Subjective: no complaints  Objective: Blood pressure 104/64, pulse 66, temperature 98.4 F (36.9 C), temperature source Oral, resp. rate 18, height 5\' 10"  (1.778 m), weight 117.028 kg (258 lb), last menstrual period 11/08/2012, SpO2 100.00%, unknown if currently breastfeeding.  Physical Exam:  General: alert and cooperative Lochia: appropriate Uterine Fundus: firm Incision: N/A DVT Evaluation: No evidence of DVT seen on physical exam.   Recent Labs  08/19/13 2345 08/21/13 0613  HGB 13.3 12.1  HCT 39.4 35.9*    Assessment/Plan: Per Orders   LOS: 2 days   Deanna Norton,Deanna Norton E 08/21/2013, 9:17 AM

## 2013-08-22 ENCOUNTER — Encounter (HOSPITAL_COMMUNITY): Payer: Self-pay | Admitting: Obstetrics

## 2013-08-22 NOTE — Lactation Note (Signed)
This note was copied from the chart of Deanna Norton. Lactation Consultation Note  Follow up consult:  Mother's left nipple bruised.  Provided comfort gels and reviewed using ebm. Discussed how to achieve a deeper latch and that her breasts are large and she needs to provide support for her breasts during the feeding. Baby would not wake to breastfeed but breastfed from 53:20-10:45 and cluster fed often last night. Reviewed hand expression.  Good flow of colostrum expressed.  Mother had questions regarding mother's milk tea.  Mother has asthma but states it is only allergies and in control. Provided information on moringa and fenugreek. Provided information that fenugreek can make asthma worse and by using only one herb at a time versus the tea she can isolate what is causing symptoms.  Encouraged mother to call RN or LC to view next feeding.  Patient Name: Deanna Norton [id number] Date: 08/22/2013 Reason for consult: Follow-up assessment   Maternal Data    Feeding Feeding Type: Breast Fed Length of feed: 5 min  LATCH Score/Interventions                      Lactation Tools Discussed/Used     Consult Status Consult Status: Follow-up Date: 08/23/13 Follow-up type: In-patient    [name] 08/22/2013, 12:11 PM

## 2013-08-22 NOTE — Progress Notes (Signed)
Post Partum Day 1 Subjective: no complaints, up ad lib, voiding and tolerating PO  Objective: Blood pressure 106/62, pulse 80, temperature 98 F (36.7 C), temperature source Oral, resp. rate 18, height 5\' 10"  (1.778 m), weight 258 lb (117.028 kg), last menstrual period 11/08/2012, SpO2 99.00%, unknown if currently breastfeeding.  Physical Exam:  General: alert and cooperative Lochia: appropriate Uterine Fundus: firm Incision: perineum intact DVT Evaluation: No evidence of DVT seen on physical exam. Negative Homan's sign. No cords or calf tenderness. No significant calf/ankle edema.   Recent Labs  08/19/13 2345 08/21/13 0613  HGB 13.3 12.1  HCT 39.4 35.9*    Assessment/Plan: Plan for discharge tomorrow   LOS: 3 days   CURTIS,CAROL G 08/22/2013, 8:06 AM

## 2013-08-23 MED ORDER — IBUPROFEN 800 MG PO TABS: 800.0000 mg | ORAL_TABLET | Freq: Three times a day (TID) | ORAL | Status: DC | PRN

## 2013-08-23 NOTE — Discharge Summary (Signed)
Obstetric Discharge Summary Reason for Admission: rupture of membranes Prenatal Procedures: ultrasound Intrapartum Procedures: spontaneous vaginal delivery Postpartum Procedures: none Complications-Operative and Postpartum: 2 degree perineal laceration Hemoglobin  Date Value Ref Range Status  08/21/2013 12.1  12.0 - 15.0 g/dL Final     HCT  Date Value Ref Range Status  08/21/2013 35.9* 36.0 - 46.0 % Final    Physical Exam:  General: alert and cooperative Lochia: appropriate Uterine Fundus: firm Incision: perineum intact DVT Evaluation: No evidence of DVT seen on physical exam. Negative Homan's sign. No cords or calf tenderness. No significant calf/ankle edema.  Discharge Diagnoses: Term Pregnancy-delivered  Discharge Information: Date: 08/23/2013 Activity: pelvic rest Diet: routine Medications: PNV and Ibuprofen Condition: stable Instructions: refer to practice specific booklet Discharge to: home   Newborn Data: Live born female  Birth Weight: 6 lb 14.1 oz (3120 g) APGAR: 9, 9  Home with mother.  CURTIS,CAROL G 08/23/2013, 8:29 AM

## 2013-08-29 ENCOUNTER — Encounter (HOSPITAL_COMMUNITY): Payer: Self-pay | Admitting: *Deleted

## 2013-08-29 ENCOUNTER — Inpatient Hospital Stay (HOSPITAL_COMMUNITY)
Admission: AD | Admit: 2013-08-29 | Discharge: 2013-08-29 | Disposition: A | Discharge status: Home / Self Care | Payer: Managed Care, Other (non HMO) | Source: Ambulatory Visit | Attending: Obstetrics and Gynecology | Admitting: Obstetrics and Gynecology

## 2013-08-29 DIAGNOSIS — O2390 Unspecified genitourinary tract infection in pregnancy, unspecified trimester: Secondary | ICD-10-CM | POA: Insufficient documentation

## 2013-08-29 DIAGNOSIS — Z87891 Personal history of nicotine dependence: Secondary | ICD-10-CM | POA: Insufficient documentation

## 2013-08-29 DIAGNOSIS — N390 Urinary tract infection, site not specified: Secondary | ICD-10-CM | POA: Insufficient documentation

## 2013-08-29 HISTORY — DX: Other specified postprocedural states: R11.2

## 2013-08-29 HISTORY — DX: Nausea with vomiting, unspecified: Z98.890

## 2013-08-29 LAB — URINE MICROSCOPIC-ADD ON

## 2013-08-29 LAB — URINALYSIS, ROUTINE W REFLEX MICROSCOPIC
BILIRUBIN URINE: NEGATIVE
Glucose, UA: NEGATIVE mg/dL
KETONES UR: NEGATIVE mg/dL
Nitrite: NEGATIVE
Protein, ur: NEGATIVE mg/dL
Specific Gravity, Urine: <1.005 — ABNORMAL LOW (ref 1.005–1.030)
UROBILINOGEN UA: 0.2 mg/dL (ref 0.0–1.0)
pH: 7 (ref 5.0–8.0)

## 2013-08-29 MED ORDER — NITROFURANTOIN MONOHYD MACRO 100 MG PO CAPS: 100.0000 mg | ORAL_CAPSULE | Freq: Two times a day (BID) | ORAL | Status: DC

## 2013-08-29 MED ORDER — NITROFURANTOIN MONOHYD MACRO 100 MG PO CAPS
100.0000 mg | ORAL_CAPSULE | Freq: Once | ORAL | Status: AC
  Administered 2013-08-29: 100 mg via ORAL
  Filled 2013-08-29: qty 1

## 2013-08-29 NOTE — MAU Note (Signed)
PT SAYS SHE DEL VAG- ON 6-14-  DR HOLLAND.  BREASTFEEDING.    TODAY AT 4 PM-  UNABLE TO EMPTY BLADDER  AND THEN AT 0230-  HAS BURNING WITH  VOIDING.

## 2013-08-29 NOTE — MAU Provider Note (Signed)
History     CSN: [id number]  Arrival date and time: 08/29/13 0246   First Lowen Barringer Initiated Contact with Patient 08/29/13 0330      Chief Complaint  Patient presents with  . Dysuria   Dysuria  Pertinent negatives include no chills, frequency, hematuria, nausea, urgency or vomiting.    Pt is a 28 yo G1P1001 here s/p NSVD on 08/21/13. Reports difficulty emptying bladder and dysuria.  Denies fever, body aches, and chills.     Past Medical History  Diagnosis Date  . Anxiety   . Stomach ulcer     in college  . Ovarian cyst   . Asthma   . Kidney infection 07/27/12  . C. difficile diarrhea 2014    Completed Flagyl  . PONV (postoperative nausea and vomiting)     Past Surgical History  Procedure Laterality Date  . No past surgeries      Family History  Problem Relation Age of Onset  . Diabetes Mother   . Diabetes Other   . Heart attack Other   . Alcohol abuse Father   . Heart disease Father   . Hypertension Father   . Mental illness Father   . Cancer Maternal Grandmother     breast  . Hearing loss Paternal Grandfather     MI - 15   . Hypertension Paternal Grandfather   . Heart disease Paternal Grandfather     History  Substance Use Topics  . Smoking status: Former Smoker -- 0.25 packs/day    Types: Cigarettes    Quit date: 11/08/2012  . Smokeless tobacco: Never Used  . Alcohol Use: No     Comment: rare    Allergies:  Allergies  Allergen Reactions  . Dilaudid [Hydromorphone Hcl] Shortness Of Breath    IV dilaudid causes nerve & muscle burning  . Sulfa Antibiotics Hives    Prescriptions prior to admission  Medication Sig Dispense Refill  . Prenatal Vit-Fe Fumarate-FA (PRENATAL MULTIVITAMIN) TABS tablet Take 1 tablet by mouth daily at 12 noon.      Marland Kitchen albuterol (PROVENTIL HFA;VENTOLIN HFA) 108 (90 BASE) MCG/ACT inhaler Inhale 2 puffs into the lungs every 4 (four) hours as needed for wheezing or shortness of breath.  1 Inhaler  2  . ibuprofen  (ADVIL,MOTRIN) 800 MG tablet Take 1 tablet (800 mg total) by mouth every 8 (eight) hours as needed for moderate pain.  30 tablet  1    Review of Systems  Constitutional: Negative for fever and chills.  Gastrointestinal: Positive for abdominal pain (cramping). Negative for nausea and vomiting.  Genitourinary: Positive for dysuria. Negative for urgency, frequency and hematuria.   Physical Exam   Blood pressure 120/90, pulse 89, temperature 97.8 F (36.6 C), temperature source Oral, resp. rate 20, last menstrual period 11/08/2012, unknown if currently breastfeeding.  Physical Exam  Constitutional: She is oriented to person, place, and time. She appears well-developed and well-nourished. No distress.  HENT:  Head: Normocephalic.  Neck: Normal range of motion. Neck supple.  Cardiovascular: Normal rate, regular rhythm and normal heart sounds.   Respiratory: Effort normal and breath sounds normal. No respiratory distress.  GI: Soft. There is no tenderness. There is no CVA tenderness.  Musculoskeletal: Normal range of motion. She exhibits no edema.  Neurological: She is alert and oriented to person, place, and time. She has normal reflexes.  Skin: Skin is warm and dry.    MAU Course  Procedures Results for orders placed during the hospital encounter of 08/29/13 (from  the past 24 hour(s))  URINALYSIS, ROUTINE W REFLEX MICROSCOPIC     Status: Abnormal   Collection Time    08/29/13  2:53 AM      Result Value Ref Range   Color, Urine YELLOW  YELLOW   APPearance CLEAR  CLEAR   Specific Gravity, Urine <1.005 (*) 1.005 - 1.030   pH 7.0  5.0 - 8.0   Glucose, UA NEGATIVE  NEGATIVE mg/dL   Hgb urine dipstick LARGE (*) NEGATIVE   Bilirubin Urine NEGATIVE  NEGATIVE   Ketones, ur NEGATIVE  NEGATIVE mg/dL   Protein, ur NEGATIVE  NEGATIVE mg/dL   Urobilinogen, UA 0.2  0.0 - 1.0 mg/dL   Nitrite NEGATIVE  NEGATIVE   Leukocytes, UA SMALL (*) NEGATIVE  URINE MICROSCOPIC-ADD ON     Status: Abnormal    Collection Time    08/29/13  2:53 AM      Result Value Ref Range   Squamous Epithelial / LPF FEW (*) RARE   WBC, UA 3-6  <3 WBC/hpf   RBC / HPF 3-6  <3 RBC/hpf   Bacteria, UA RARE  RARE    Assessment and Plan  Urinary Tract Infection  Plan: Discharge to home Urine culture sent to lab RX Macrobid 100 mg BID x 5 days (pt concerned regarding hx of getting c.diff after UTI tx in May 2014 after receiving rocephin IV and Keflex PO)   [facility] 08/29/2013, 3:32 AM

## 2013-08-29 NOTE — MAU Note (Signed)
Pt states she has been feeling fullness in her bladder and only able to urinate small amounts. States that it burns when she uses the bathroom. Denies fever chills.

## 2013-08-30 LAB — URINE CULTURE

## 2013-09-22 ENCOUNTER — Inpatient Hospital Stay (HOSPITAL_COMMUNITY)
Admission: AD | Admit: 2013-09-22 | Discharge: 2013-09-22 | Disposition: A | Discharge status: Home / Self Care | Payer: Managed Care, Other (non HMO) | Source: Ambulatory Visit | Attending: Obstetrics and Gynecology | Admitting: Obstetrics and Gynecology

## 2013-09-22 ENCOUNTER — Encounter (HOSPITAL_COMMUNITY): Payer: Self-pay | Admitting: *Deleted

## 2013-09-22 DIAGNOSIS — O99893 Other specified diseases and conditions complicating puerperium: Secondary | ICD-10-CM | POA: Insufficient documentation

## 2013-09-22 DIAGNOSIS — O9989 Other specified diseases and conditions complicating pregnancy, childbirth and the puerperium: Principal | ICD-10-CM

## 2013-09-22 DIAGNOSIS — J4521 Mild intermittent asthma with (acute) exacerbation: Secondary | ICD-10-CM

## 2013-09-22 DIAGNOSIS — Z87891 Personal history of nicotine dependence: Secondary | ICD-10-CM | POA: Insufficient documentation

## 2013-09-22 DIAGNOSIS — J45901 Unspecified asthma with (acute) exacerbation: Secondary | ICD-10-CM

## 2013-09-22 MED ORDER — IPRATROPIUM-ALBUTEROL 0.5-2.5 (3) MG/3ML IN SOLN
3.0000 mL | RESPIRATORY_TRACT | Status: DC
  Filled 2013-09-22 (×5): qty 3

## 2013-09-22 NOTE — Discharge Instructions (Signed)

## 2013-09-22 NOTE — MAU Note (Signed)
Pt states she had "a typical asthma attack, but her inhaler didn't work"Pt states attack started around 1930 and she used her inhalter at 2000. But it didn't work. Pt states she feel better being out of the house. Pt states she is not sure she is not able to take her zyrtec, because she is breast feeding she didn't know how it was going to progress or when it was going to go away. Pt states she feels a lot better at this time t

## 2013-09-22 NOTE — MAU Note (Addendum)
WHILE PREG  DEVELOPED -    ALLERGY-  MOLD AND DUST MITES.   INDUCED ASTHMA.     ALL STARTED  AT 95PM-  USED INHALER X2 -  DID NOT WORK.       DELIVERED   ON 6-14- VAG  WITH DR HOLLAND .  BREAST FEEDING.     SAYS SHE FEELS BETTER SINCE OUT OF HOUSE.     SHE ALSO TOOK ROBITUSSIN AT 8PM-  FOR HER =COUGHING

## 2013-09-22 NOTE — MAU Provider Note (Signed)
History     CSN: [id number]  Arrival date and time: 09/22/13 2145   First Provider Initiated Contact with Patient 09/22/13 2232      Chief Complaint  Patient presents with  . Asthma   HPI Ms. Deanna Norton is a 28 y.o. G1P1001 who is ~ 4 weeks PP after NSVD who presents to MAU today with complaint of an asthma attack. The patient states that she had her house cleaned today and feels that a lot of dust was created and this is a trigger for her asthma. She has not been taking her Zyrtec because she was told by lactation that it would decrease her milk supply. She took her inhaler when the SOB started and felt some relief, but still feels that she is wheezing upon arrival. She denies pain.   OB History   Grav Para Term Preterm Abortions TAB SAB Ect Mult Living   1 1 1  0 0 0 0 0 0 1      Past Medical History  Diagnosis Date  . Anxiety   . Stomach ulcer     in college  . Ovarian cyst   . Asthma   . Kidney infection 07/27/12  . C. difficile diarrhea 2014    Completed Flagyl  . PONV (postoperative nausea and vomiting)     Past Surgical History  Procedure Laterality Date  . No past surgeries      Family History  Problem Relation Age of Onset  . Diabetes Mother   . Diabetes Other   . Heart attack Other   . Alcohol abuse Father   . Heart disease Father   . Hypertension Father   . Mental illness Father   . Cancer Maternal Grandmother     breast  . Hearing loss Paternal Grandfather     MI - 63   . Hypertension Paternal Grandfather   . Heart disease Paternal Grandfather     History  Substance Use Topics  . Smoking status: Former Smoker -- 0.25 packs/day    Types: Cigarettes    Quit date: 11/08/2012  . Smokeless tobacco: Never Used  . Alcohol Use: No     Comment: rare    Allergies:  Allergies  Allergen Reactions  . Dilaudid [Hydromorphone Hcl] Shortness Of Breath    IV dilaudid causes nerve & muscle burning  . Sulfa Antibiotics Hives    No prescriptions  prior to admission    Review of Systems  Constitutional: Negative for fever and malaise/fatigue.  Respiratory: Positive for cough and wheezing. Negative for shortness of breath.   Cardiovascular: Negative for chest pain.   Physical Exam   Blood pressure 122/82, pulse 83, temperature 98.2 F (36.8 C), temperature source Oral, resp. rate 16, height 5\' 9"  (1.753 m), weight 243 lb 2 oz (110.281 kg), SpO2 100.00%, unknown if currently breastfeeding.  Physical Exam  Constitutional: She is oriented to person, place, and time. She appears well-developed and well-nourished. No distress.  HENT:  Head: Normocephalic.  Cardiovascular: Normal rate.   Respiratory: Effort normal. No respiratory distress. She has wheezes.  Neurological: She is alert and oriented to person, place, and time.  Skin: Skin is warm and dry. No erythema.  Psychiatric: She has a normal mood and affect.    MAU Course  Procedures  MDM Discussed with Dr. Corinna Capra. Ok to give Nebulizer treatment. Continued inhaler. Ok to restart Zyrtec if needed While waiting for respiratory, patient states symptoms have improved significantly and she does not feel that  she needs the nebulizer treatment now. Upon exam wheezing has stopped completely Patient states that she will go home and take inhaler again soon if needed   Assessment and Plan  A: Asthma exacerbation  P: Discharge home Patient advised to continue albuterol PRN Patient advised to discuss concerns about Zyrtec with her OB/gyn at [facility] Patient may return to MAU as needed or if her condition were to change or worsen   Farris Has, PA-C  09/23/2013, 2:01 AM

## 2013-10-12 IMAGING — CR DG LUMBAR SPINE COMPLETE 4+V
5 series · 5 of 5 positions shown · non-contrast
Comparison: None.

CLINICAL DATA: Left leg pain and weakness for 1 day.

LUMBAR SPINE - COMPLETE 4+ VIEW

[t lumbar spine ap]
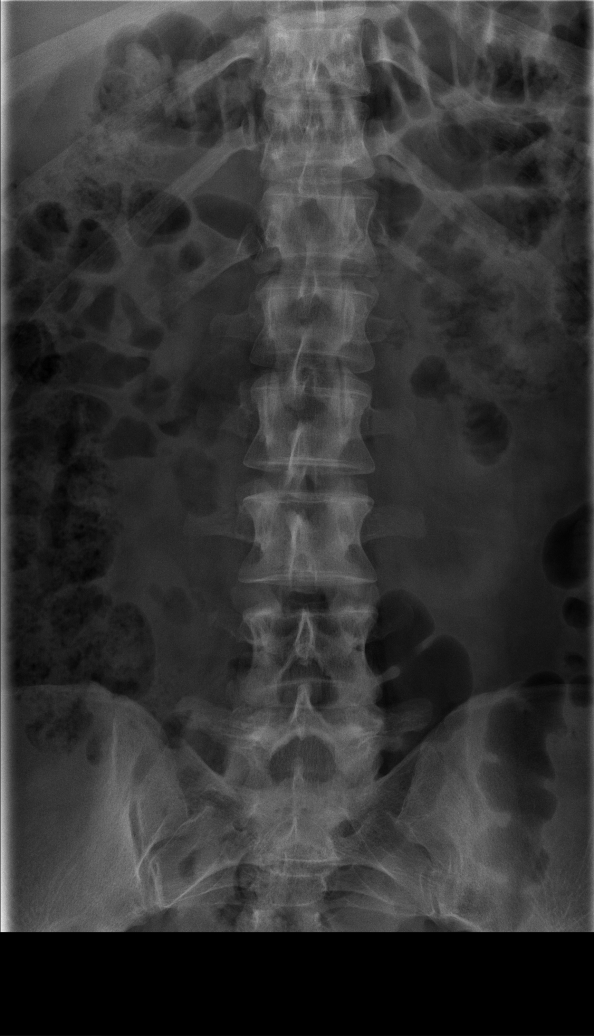

[t lumbar spine obl (1 of 2)]
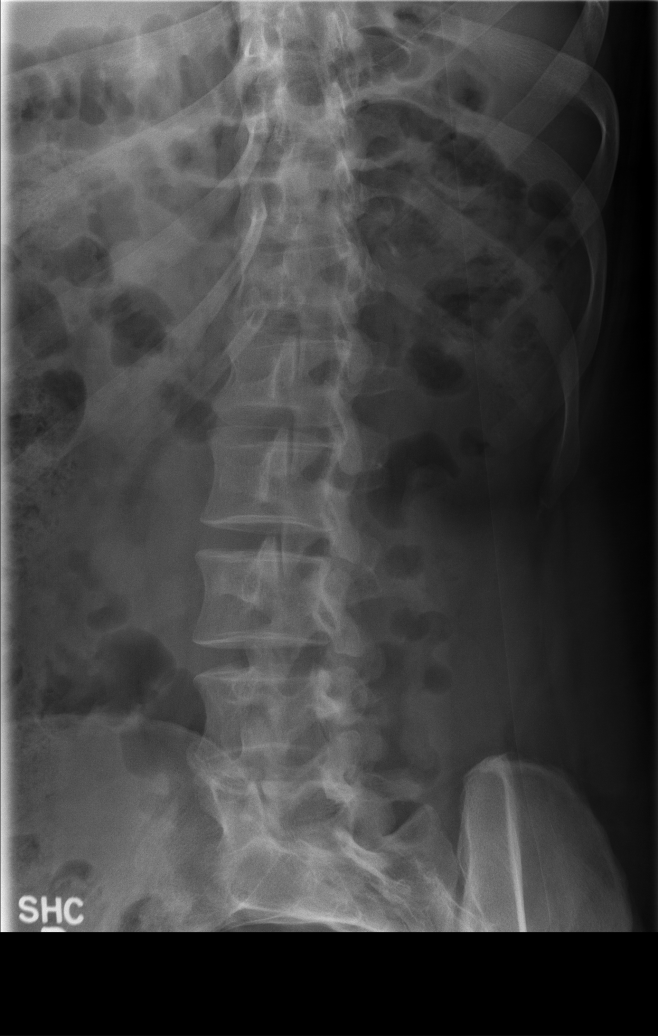

[t lumbar spine obl (2 of 2)]
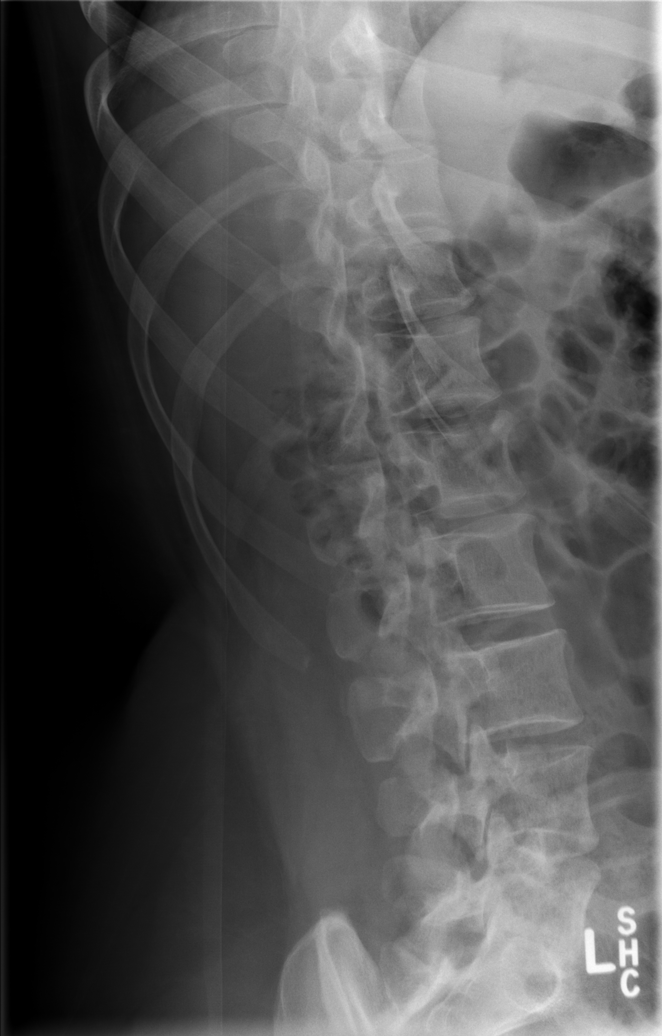

[t lumbar spine lat]
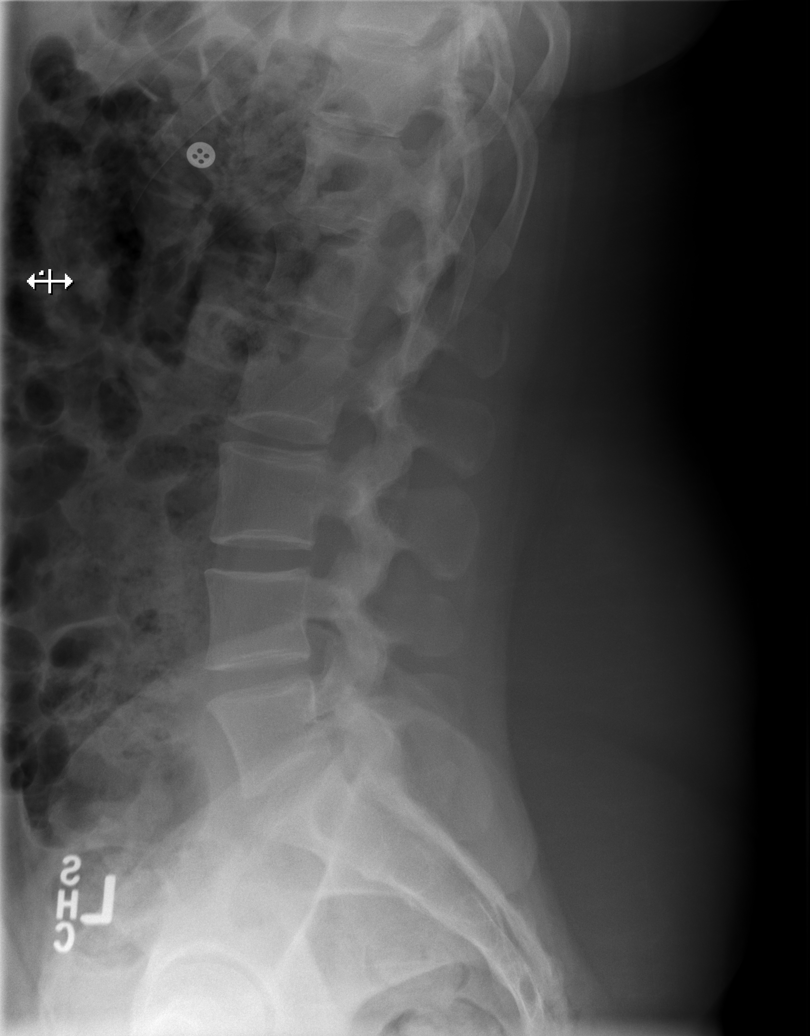

[t lumbar l-5 s-1 spot]
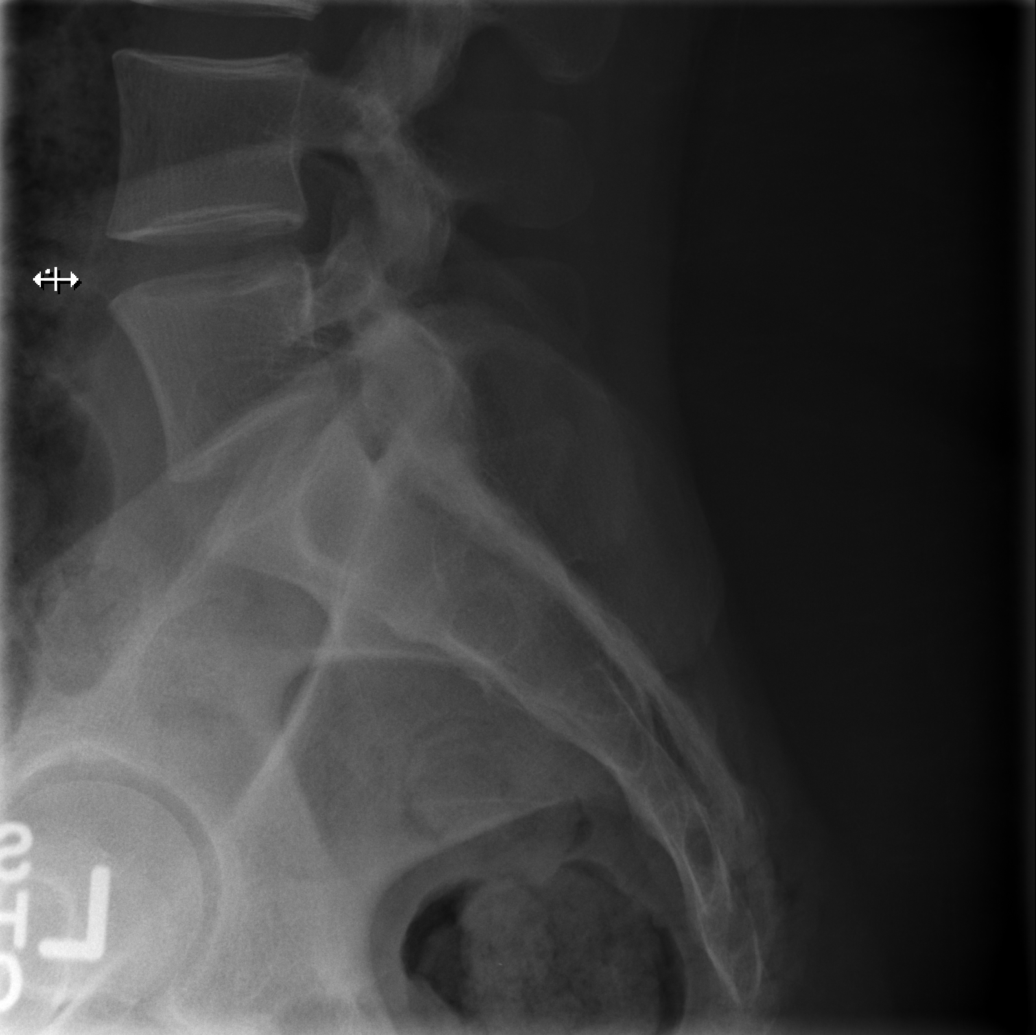

[5 of 5 positions shown; findings below may reference images not displayed]

FINDINGS: There are five lumbar type vertebral bodies.  Alignment
is normal.  There is no evidence of fracture or pars defect.  The
lumbar disc spaces are preserved.
IMPRESSION: Normal lumbar spine radiographs.

## 2014-01-09 ENCOUNTER — Encounter (HOSPITAL_COMMUNITY): Payer: Self-pay | Admitting: *Deleted

## 2014-02-07 HISTORY — PX: CHOLECYSTECTOMY: SHX55

## 2015-03-20 ENCOUNTER — Encounter: Payer: Self-pay | Admitting: Family Medicine

## 2015-03-20 ENCOUNTER — Ambulatory Visit (HOSPITAL_COMMUNITY)
Admission: RE | Admit: 2015-03-20 | Discharge: 2015-03-20 | Disposition: A | Discharge status: Home / Self Care | Payer: 59 | Source: Ambulatory Visit | Attending: Family Medicine | Admitting: Family Medicine

## 2015-03-20 ENCOUNTER — Ambulatory Visit (INDEPENDENT_AMBULATORY_CARE_PROVIDER_SITE_OTHER): Payer: PRIVATE HEALTH INSURANCE | Admitting: Family Medicine

## 2015-03-20 VITALS — BP 122/88 | HR 65 | Temp 98.0°F | Ht 69.0 in | Wt 250.0 lb

## 2015-03-20 DIAGNOSIS — R109 Unspecified abdominal pain: Secondary | ICD-10-CM

## 2015-03-20 LAB — BASIC METABOLIC PANEL
BUN: 14 mg/dL (ref 6–23)
CHLORIDE: 102 meq/L (ref 96–112)
CO2: 28 meq/L (ref 19–32)
CREATININE: 1.07 mg/dL (ref 0.40–1.20)
Calcium: 9.5 mg/dL (ref 8.4–10.5)
GFR: 65.19 mL/min (ref >60.00)
Glucose, Bld: 86 mg/dL (ref 70–99)
Potassium: 4.4 mEq/L (ref 3.5–5.1)
Sodium: 138 mEq/L (ref 135–145)

## 2015-03-20 LAB — HEPATIC FUNCTION PANEL
ALT: 19 U/L (ref 0–35)
AST: 18 U/L (ref 0–37)
Albumin: 4.3 g/dL (ref 3.5–5.2)
Alkaline Phosphatase: 86 U/L (ref 39–117)
BILIRUBIN DIRECT: 0.1 mg/dL (ref 0.0–0.3)
BILIRUBIN TOTAL: 0.6 mg/dL (ref 0.2–1.2)
Total Protein: 7 g/dL (ref 6.0–8.3)

## 2015-03-20 LAB — POCT URINALYSIS DIPSTICK
Bilirubin, UA: NEGATIVE
Glucose, UA: NEGATIVE
Ketones, UA: NEGATIVE
LEUKOCYTES UA: NEGATIVE
Nitrite, UA: NEGATIVE
PROTEIN UA: NEGATIVE
UROBILINOGEN UA: 0.2
pH, UA: 6

## 2015-03-20 LAB — CBC WITH DIFFERENTIAL/PLATELET
Basophils Absolute: 0 10*3/uL (ref 0.0–0.1)
Basophils Relative: 0.3 % (ref 0.0–3.0)
Eosinophils Absolute: 0.4 10*3/uL (ref 0.0–0.7)
Eosinophils Relative: 4.4 % (ref 0.0–5.0)
HCT: 47.5 % — ABNORMAL HIGH (ref 36.0–46.0)
Hemoglobin: 15.5 g/dL — ABNORMAL HIGH (ref 12.0–15.0)
Lymphocytes Relative: 28.4 % (ref 12.0–46.0)
Lymphs Abs: 2.6 10*3/uL (ref 0.7–4.0)
MCHC: 32.6 g/dL (ref 30.0–36.0)
MCV: 87 fl (ref 78.0–100.0)
Monocytes Absolute: 0.5 10*3/uL (ref 0.1–1.0)
Monocytes Relative: 5 % (ref 3.0–12.0)
Neutro Abs: 5.7 10*3/uL (ref 1.4–7.7)
Neutrophils Relative %: 61.9 % (ref 43.0–77.0)
Platelets: 278 10*3/uL (ref 150.0–400.0)
RBC: 5.46 Mil/uL — ABNORMAL HIGH (ref 3.87–5.11)
RDW: 13.8 % (ref 11.5–15.5)
WBC: 9.2 10*3/uL (ref 4.0–10.5)

## 2015-03-20 LAB — POCT URINE PREGNANCY: Preg Test, Ur: NEGATIVE

## 2015-03-20 MED ORDER — IOHEXOL 300 MG/ML  SOLN
50.0000 mL | Freq: Once | INTRAMUSCULAR | Status: AC | PRN
  Administered 2015-03-20: 50 mL via ORAL

## 2015-03-20 MED ORDER — IOHEXOL 300 MG/ML  SOLN
100.0000 mL | Freq: Once | INTRAMUSCULAR | Status: AC | PRN
  Administered 2015-03-20: 100 mL via INTRAVENOUS

## 2015-03-20 NOTE — Progress Notes (Signed)
   Subjective:    Patient ID: Deanna Norton, female    DOB: January 13, 1986, 30 y.o.   MRN: [id number]  HPI Here for 2 days of nausea without vomiting and diarrhea, now with the onset today of sharp right flank pains. These extend from the RUQ to the RLQ. As well as around the flank to the mid back. No urinary burning or urgency. No fever. Appetite is decreased. She is currently on her menses. She had Nexplanon implanted a year ago. She is S/P a cholecystectomy.    Review of Systems  Constitutional: Negative.   HENT: Negative.   Eyes: Negative.   Respiratory: Negative.   Cardiovascular: Negative.   Gastrointestinal: Positive for nausea, abdominal pain and diarrhea. Negative for vomiting, constipation, blood in stool, abdominal distention, anal bleeding and rectal pain.  Genitourinary: Positive for flank pain. Negative for dysuria, urgency, frequency and hematuria.       Objective:   Physical Exam  Constitutional: She appears well-developed and well-nourished. No distress.  Neck: No thyromegaly present.  Cardiovascular: Normal rate, regular rhythm, normal heart sounds and intact distal pulses.   Pulmonary/Chest: Effort normal and breath sounds normal.  Abdominal: Soft. Bowel sounds are normal. She exhibits no distension and no mass. There is no rebound and no guarding.  Mildly tender in the RUQ, moderately tender in the RLQ and right flank  Lymphadenopathy:    She has no cervical adenopathy.  Skin: No rash noted.          Assessment & Plan:  Right flank pain which is worrisome for possible appendicitis. Labs are pending. We will set her up for an abdominal and pelvic CT scan.

## 2015-03-20 NOTE — Progress Notes (Signed)
Pre visit review using our clinic review tool, if applicable. No additional management support is needed unless otherwise documented below in the visit note. 

## 2015-03-26 ENCOUNTER — Telehealth: Payer: Self-pay | Admitting: Family Medicine

## 2015-03-26 NOTE — Telephone Encounter (Signed)
FYI

## 2015-03-26 NOTE — Telephone Encounter (Signed)
[place]  Patient Name: Deanna Norton  DOB: 1986-03-07    Initial Comment Caller states saw Dr Sarajane Jews last week, had CT scan done, concerened it was appendix,but not, having diarrhea, when she eats there is a tightening and bloating under rib area where gallbladder was, now having diarrhea and stomach cramping   Nurse Assessment  Nurse: Harlow Mares, RN, Suanne Marker Date/Time (Eastern Time): 03/26/2015 10:24:40 AM  Confirm and document reason for call. If symptomatic, describe symptoms. You must click the next button to save text entered. ---Caller states saw Dr Sarajane Jews last week, had CT scan done, concerened it was appendix,but not, having diarrhea now (began 2 days ago), when she eats there is a tightening and bloating under rib area on the R side where gallbladder was (has had removed), now having diarrhea and stomach cramping. Watery diarrhea without fever. Rates pain 1/10, strange tightness and bloating. No vomiting.  Has the patient traveled out of the country within the last 30 days? ---No  Does the patient have any new or worsening symptoms? ---Yes  Will a triage be completed? ---Yes  Related visit to physician within the last 2 weeks? ---Yes  Does the PT have any chronic conditions? (i.e. diabetes, asthma, etc.) ---No  Did the patient indicate they were pregnant? ---No  Is this a behavioral health or substance abuse call? ---No     Guidelines    Guideline Title Affirmed Question Affirmed Notes  Abdominal Pain - Female [1] MILD-MODERATE pain AND [2] constant AND [3] present > 2 hours    Final Disposition User   See Physician within 4 Hours (or PCP triage) Harlow Mares, RN, Rhonda    Comments  Caller refused to be seen today, citing that she can wait until tomorrow and she prefers to see Dr. Sarajane Jews, whom had no openings for today. Appt scheduled for tomorrow 03/27/15 @ 10:30a.   Referrals  GO TO FACILITY REFUSED

## 2015-03-27 ENCOUNTER — Telehealth: Payer: Self-pay | Admitting: Family Medicine

## 2015-03-27 ENCOUNTER — Ambulatory Visit: Payer: Self-pay | Admitting: Family Medicine

## 2015-03-27 NOTE — Telephone Encounter (Signed)
Pt requesting call back regarding questions she has about CT scan done last week.

## 2015-03-28 NOTE — Telephone Encounter (Signed)
Left message for patient to return phone call.  

## 2015-03-28 NOTE — Addendum Note (Signed)
Addended by: Alysia Penna A on: 03/28/2015 12:27 PM   Modules accepted: Orders

## 2015-03-28 NOTE — Telephone Encounter (Signed)
I spoke with pt and explained that pt should follow up with her primary care doctor to discuss referral.

## 2015-03-28 NOTE — Telephone Encounter (Signed)
Patient called again inquiring about the return phone call.  She also stated she just need a referral to a GI doctor.

## 2015-03-28 NOTE — Telephone Encounter (Signed)
Spoke with patient and she is aware Dr. Sarajane Jews placed referral to GI. Patient is also aware to contact office for any other symptoms she may be having or if does not hear from GI by next Tuesday. Patient verbalized understanding and subjectively sounded at ease to hear an intervention was being made.

## 2015-03-30 ENCOUNTER — Ambulatory Visit (INDEPENDENT_AMBULATORY_CARE_PROVIDER_SITE_OTHER): Payer: PRIVATE HEALTH INSURANCE | Admitting: Gastroenterology

## 2015-03-30 ENCOUNTER — Encounter: Payer: Self-pay | Admitting: Gastroenterology

## 2015-03-30 VITALS — BP 118/82 | HR 92 | Ht 70.5 in | Wt 248.2 lb

## 2015-03-30 DIAGNOSIS — R197 Diarrhea, unspecified: Secondary | ICD-10-CM

## 2015-03-30 DIAGNOSIS — R1011 Right upper quadrant pain: Secondary | ICD-10-CM

## 2015-03-30 MED ORDER — DICYCLOMINE HCL 10 MG PO CAPS: 10.0000 mg | ORAL_CAPSULE | Freq: Three times a day (TID) | ORAL | Status: DC

## 2015-03-30 NOTE — Progress Notes (Signed)
    History of Present Illness: This is a 30 year old female referred by Laurey Morale, MD for the evaluation of N/V/D, RUQ abdominal pain and right flank pain. Patient presented to Dr. Sarajane Jews with a 2 day history of nausea, vomiting, diarrhea and sharp right flank pains radiating to the mid back about 10 days ago. She has had a prior cholecystectomy for biliary dyskinesia in [place], [place] in December 2015. She states her gallbladder ejection fraction was 10% and she did not have stones. Her nausea and vomiting resolved however she has had persistent right upper quadrant pain that generally follows meals and is described as a "tennis ball" sensation. She has had intermittent loose stools 2-3 times per day without bleeding. Following her gallbladder surgery she had 1-2 months of loose stools and then her bowel habits have returned to normal. Blood work, urinalysis and pregnancy testing was unremarkable in 03/2015. CT scan as below. Denies weight loss, constipation, change in stool caliber, melena, hematochezia, dysphagia, reflux symptoms, chest pain.  03/20/2015 Abd/pelvic CT IMPRESSION: 1. No acute abdominal or pelvic pathology.  Review of Systems: Pertinent positive and negative review of systems were noted in the above HPI section. All other review of systems were otherwise negative.  Current Medications, Allergies, Past Medical History, Past Surgical History, Family History and Social History were reviewed in [employer] record.  Physical Exam: General: Well developed, well nourished, no acute distress Head: Normocephalic and atraumatic Eyes:  sclerae anicteric, EOMI Ears: Normal auditory acuity Mouth: No deformity or lesions Neck: Supple, no masses or thyromegaly Lungs: Clear throughout to auscultation Heart: Regular rate and rhythm; no murmurs, rubs or bruits Abdomen: Soft, non tender and non distended. No masses, hepatosplenomegaly or hernias noted. Normal Bowel  sounds Musculoskeletal: Symmetrical with no gross deformities  Skin: No lesions on visible extremities Pulses:  Normal pulses noted Extremities: No clubbing, cyanosis, edema or deformities noted Neurological: Alert oriented x 4, grossly nonfocal Cervical Nodes:  No significant cervical adenopathy Inguinal Nodes: No significant inguinal adenopathy Psychological:  Alert and cooperative. Normal mood and affect  Assessment and Recommendations:  1. RUQ pain with mild diarrhea. Resolved right flank pain and vomiting. 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 Doubt SOD. Bland diet avoiding milk products, high fat foods, raw fruits, raw vegetables, caffeine, coffee and alcohol. Begin dicyclomine 10 mg 3 times a day before meals. Probiotic, such as Align, daily for 1 month. Return office visit 1 month.  2. Status post cholecystectomy for biliary dyskinesia and December 2015.    cc: Laurey Morale, MD [address] [place], [place] [address]

## 2015-03-30 NOTE — Patient Instructions (Signed)
We have sent the following medications to your pharmacy for you to pick up at your convenience:Bentyl.  Please remain on a Bland diet.   Your follow up appointment with Dr. Fuller Plan is on 05/02/15 at 9:15am.   Thank you for choosing me and [facility].  Pricilla Riffle. Dagoberto Ligas., MD., Marval Regal

## 2015-05-02 ENCOUNTER — Ambulatory Visit: Payer: PRIVATE HEALTH INSURANCE | Admitting: Gastroenterology

## 2015-06-13 ENCOUNTER — Other Ambulatory Visit (INDEPENDENT_AMBULATORY_CARE_PROVIDER_SITE_OTHER): Payer: PRIVATE HEALTH INSURANCE

## 2015-06-13 ENCOUNTER — Encounter: Payer: Self-pay | Admitting: Gastroenterology

## 2015-06-13 ENCOUNTER — Ambulatory Visit (INDEPENDENT_AMBULATORY_CARE_PROVIDER_SITE_OTHER): Payer: PRIVATE HEALTH INSURANCE | Admitting: Gastroenterology

## 2015-06-13 VITALS — BP 116/80 | HR 72 | Ht 70.5 in | Wt 255.0 lb

## 2015-06-13 DIAGNOSIS — R197 Diarrhea, unspecified: Secondary | ICD-10-CM

## 2015-06-13 DIAGNOSIS — R1011 Right upper quadrant pain: Secondary | ICD-10-CM

## 2015-06-13 LAB — IGA: IgA: 336 mg/dL (ref 68–378)

## 2015-06-13 MED ORDER — GLYCOPYRROLATE 2 MG PO TABS: 2.0000 mg | ORAL_TABLET | Freq: Two times a day (BID) | ORAL | Status: DC

## 2015-06-13 NOTE — Patient Instructions (Signed)
Discontinue Bentyl.  We have sent the following medications to your pharmacy for you to pick up at your convenience:robinul.  Your physician has requested that you go to the basement for the following lab work before leaving today:TTG, IGA.  Normal BMI (Body Mass Index- based on height and weight) is between 19 and 25. Your BMI today is Body mass index is 36.06 kg/(m^2). Marland Kitchen Please consider follow up  regarding your BMI with your Primary Care Provider.   Thank you for choosing me and [facility].  Pricilla Riffle. Dagoberto Ligas., MD., Marval Regal

## 2015-06-13 NOTE — Progress Notes (Signed)
    History of Present Illness: This is a 30 year old female returning for follow-up of diarrhea and abdominal pain. Her symptoms have substantially improved with dietary adjustments. Dicyclomine has been helpful to reduce the severity and frequency of her right upper quadrant pain however it has not eliminated her symptoms.  Current Medications, Allergies, Past Medical History, Past Surgical History, Family History and Social History were reviewed in [employer] record.  Physical Exam: General: Well developed, well nourished, no acute distress Head: Normocephalic and atraumatic Eyes:  sclerae anicteric, EOMI Ears: Normal auditory acuity Mouth: No deformity or lesions Lungs: Clear throughout to auscultation Heart: Regular rate and rhythm; no murmurs, rubs or bruits Abdomen: Soft, non tender and non distended. No masses, hepatosplenomegaly or hernias noted. Normal Bowel sounds Musculoskeletal: Symmetrical with no gross deformities  Pulses:  Normal pulses noted Extremities: No clubbing, cyanosis, edema or deformities noted Neurological: Alert oriented x 4, grossly nonfocal Psychological:  Alert and cooperative. Normal mood and affect  Assessment and Recommendations:  1. RUQ pain with mild diarrhea. 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 Continue bland diet avoiding milk products, high fat foods, raw fruits, raw vegetables, caffeine, coffee and alcohol. tTG and IgA today. Discontinue dicyclomine and begin glycopyrrolate 2 mg twice a day. Continue Align daily. Return office visit 6-8 weeks if symptoms not substantially improved.  2. Status post cholecystectomy for biliary dyskinesia and December 2015.  I spent 15 minutes of face-to-face time with the patient. Greater than 50% of the time was spent counseling and coordinating care.

## 2015-06-14 LAB — TISSUE TRANSGLUTAMINASE, IGA: TISSUE TRANSGLUTAMINASE AB, IGA: 1 U/mL (ref <4)

## 2016-01-05 ENCOUNTER — Encounter (HOSPITAL_COMMUNITY): Payer: Self-pay | Admitting: *Deleted

## 2016-01-05 ENCOUNTER — Emergency Department (HOSPITAL_COMMUNITY): Payer: 59

## 2016-01-05 ENCOUNTER — Emergency Department (HOSPITAL_COMMUNITY)
Admission: EM | Admit: 2016-01-05 | Discharge: 2016-01-05 | Disposition: A | Discharge status: Home / Self Care | Payer: 59 | Attending: Emergency Medicine | Admitting: Emergency Medicine

## 2016-01-05 DIAGNOSIS — R109 Unspecified abdominal pain: Secondary | ICD-10-CM

## 2016-01-05 DIAGNOSIS — N760 Acute vaginitis: Secondary | ICD-10-CM | POA: Diagnosis not present

## 2016-01-05 DIAGNOSIS — Z87891 Personal history of nicotine dependence: Secondary | ICD-10-CM | POA: Insufficient documentation

## 2016-01-05 DIAGNOSIS — J45909 Unspecified asthma, uncomplicated: Secondary | ICD-10-CM | POA: Diagnosis not present

## 2016-01-05 DIAGNOSIS — B9689 Other specified bacterial agents as the cause of diseases classified elsewhere: Secondary | ICD-10-CM

## 2016-01-05 DIAGNOSIS — Z79899 Other long term (current) drug therapy: Secondary | ICD-10-CM | POA: Diagnosis not present

## 2016-01-05 LAB — COMPREHENSIVE METABOLIC PANEL
ALBUMIN: 3.9 g/dL (ref 3.5–5.0)
ALT: 20 U/L (ref 14–54)
AST: 17 U/L (ref 15–41)
Alkaline Phosphatase: 57 U/L (ref 38–126)
Anion gap: 7 (ref 5–15)
BUN: 18 mg/dL (ref 6–20)
CHLORIDE: 107 mmol/L (ref 101–111)
CO2: 25 mmol/L (ref 22–32)
CREATININE: 1.18 mg/dL — AB (ref 0.44–1.00)
Calcium: 8.7 mg/dL — ABNORMAL LOW (ref 8.9–10.3)
GFR calc Af Amer: >60 mL/min (ref >60)
GFR calc non Af Amer: >60 mL/min (ref >60)
GLUCOSE: 114 mg/dL — AB (ref 65–99)
POTASSIUM: 3.6 mmol/L (ref 3.5–5.1)
Sodium: 139 mmol/L (ref 135–145)
Total Bilirubin: 0.6 mg/dL (ref 0.3–1.2)
Total Protein: 6.9 g/dL (ref 6.5–8.1)

## 2016-01-05 LAB — CBC
HEMATOCRIT: 43.6 % (ref 36.0–46.0)
Hemoglobin: 14.4 g/dL (ref 12.0–15.0)
MCH: 29.2 pg (ref 26.0–34.0)
MCHC: 33 g/dL (ref 30.0–36.0)
MCV: 88.4 fL (ref 78.0–100.0)
PLATELETS: 267 10*3/uL (ref 150–400)
RBC: 4.93 MIL/uL (ref 3.87–5.11)
RDW: 12.6 % (ref 11.5–15.5)
WBC: 7.3 10*3/uL (ref 4.0–10.5)

## 2016-01-05 LAB — URINALYSIS, ROUTINE W REFLEX MICROSCOPIC
BILIRUBIN URINE: NEGATIVE
GLUCOSE, UA: NEGATIVE mg/dL
KETONES UR: NEGATIVE mg/dL
Leukocytes, UA: NEGATIVE
Nitrite: NEGATIVE
PH: 6.5 (ref 5.0–8.0)
PROTEIN: NEGATIVE mg/dL
Specific Gravity, Urine: 1.023 (ref 1.005–1.030)

## 2016-01-05 LAB — URINE MICROSCOPIC-ADD ON

## 2016-01-05 LAB — WET PREP, GENITAL
Sperm: NONE SEEN
TRICH WET PREP: NONE SEEN
YEAST WET PREP: NONE SEEN

## 2016-01-05 LAB — LIPASE, BLOOD: LIPASE: 30 U/L (ref 11–51)

## 2016-01-05 LAB — I-STAT BETA HCG BLOOD, ED (MC, WL, AP ONLY): I-stat hCG, quantitative: <5 m[IU]/mL (ref <5)

## 2016-01-05 MED ORDER — METRONIDAZOLE 500 MG PO TABS: 500.0000 mg | ORAL_TABLET | Freq: Two times a day (BID) | ORAL | 0 refills | Status: DC

## 2016-01-05 MED ORDER — KETOROLAC TROMETHAMINE 30 MG/ML IJ SOLN: 30.0000 mg | Freq: Once | INTRAMUSCULAR | Status: DC

## 2016-01-05 MED ORDER — KETOROLAC TROMETHAMINE 30 MG/ML IJ SOLN
30.0000 mg | Freq: Once | INTRAMUSCULAR | Status: AC
  Administered 2016-01-05: 30 mg via INTRAMUSCULAR
  Filled 2016-01-05: qty 1

## 2016-01-05 NOTE — ED Triage Notes (Signed)
Pt states that she began having rt lower quad pain on Monday; pt states that the pain would come and go; pt states that it has gotten progressively worse and now is a constant dull pain with intermittent sharp stabbing pains; pt denies N/V; pt states that she is also having lower back pain; pt denies urinary sx

## 2016-01-05 NOTE — ED Notes (Signed)
Patient transported to CT 

## 2016-01-05 NOTE — ED Notes (Signed)
Pt complains of right sided low abd pain for 5 days, she states that it's a dull pain.  Pt states that she's had diarrhea for 5 days but denies vomiting

## 2016-01-05 NOTE — Discharge Instructions (Signed)
Take your medication as prescribed until completed. Continue drinking fluids at home to remain hydrated. You may continue taking Tylenol and ibuprofen as prescribed over-the-counter as needed for pain relief. Follow-up with your primary care provider within the next week if her symptoms have not improved. Please return to the Emergency Department if symptoms worsen or new onset of fever, abdominal pain, flank pain, vomiting, unable to keep fluids down, blood in emesis or stool, pain or difficulty urinating, blood in urine.

## 2016-01-05 NOTE — ED Provider Notes (Signed)
[facility] DEPT Provider Note   CSN: [id number] Arrival date & time: 01/05/16  0245     History   Chief Complaint Chief Complaint  Patient presents with  . Abdominal Pain    HPI Deanna Norton is a 30 y.o. female.  Patient is a 30 year old female with history of asthma presents to the ED with complaint of abdominal pain, onset one week. Patient reports she initially began having waxing and waning pain to her lower back with associated nausea and dysuria. She reports 5 days ago her back pain became severe for a few minutes, then resolved spontaneously and states she began having pain to the right side of her abdomen. Patient reports having constant pain to her abdomen over the past 5 days. She notes the pain is worse when laying still and his pain improves when she is up walking around. She also states she has had a few episodes of nonbloody diarrhea over the past few days. Denies fever, chills, vomiting, blood in urine or stool, vaginal discharge. Patient reports she is currently on her menstrual cycle started on 10/21. Endorses surgical history of cholecystectomy. Patient denies taking any medications for her symptoms at home.      Past Medical History:  Diagnosis Date  . Anxiety   . Asthma   . C. difficile diarrhea 2014   Completed Flagyl  . Kidney infection 07/27/12  . Ovarian cyst   . PONV (postoperative nausea and vomiting)   . Stomach ulcer    in college    Patient Active Problem List   Diagnosis Date Noted  . Normal labor 08/20/2013  . 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 Panic attacks 12/23/2011  . Overweight(278.02) 08/11/2011  . Fatigue 08/11/2011  . Gluten intolerance 08/11/2011  . Lactose intolerance 08/11/2011  . Gallbladder disease 08/11/2011  . Tobacco abuse 08/11/2011    Past Surgical History:  Procedure Laterality Date  . CHOLECYSTECTOMY  Dec 2015     OB History    Gravida Para Term Preterm AB Living   1 1 1  0 0 1   SAB TAB Ectopic Multiple Live  Births   0 0 0 0 1       Home Medications    Prior to Admission medications   Medication Sig Start Date End Date Taking? Authorizing Provider  albuterol (PROVENTIL HFA;VENTOLIN HFA) 108 (90 BASE) MCG/ACT inhaler Inhale 2 puffs into the lungs every 4 (four) hours as needed for wheezing or shortness of breath. 05/09/13  Yes Orpah Greek, MD  etonogestrel (NEXPLANON) 68 MG IMPL implant 1 each by Subdermal route continuous.   Yes Historical Provider, MD  Meth-Hyo-M Bl-Na Phos-Ph Sal (URIBEL) 118 MG CAPS Take 1 capsule by mouth daily.   Yes Historical Provider, MD  glycopyrrolate (ROBINUL) 2 MG tablet Take 1 tablet (2 mg total) by mouth 2 (two) times daily. Patient not taking: Reported on 01/05/2016 06/13/15   Ladene Artist, MD  ibuprofen (ADVIL,MOTRIN) 800 MG tablet Take 1 tablet (800 mg total) by mouth every 8 (eight) hours as needed for moderate pain. Patient not taking: Reported on 01/05/2016 08/23/13   Juanda Chance, NP  metroNIDAZOLE (FLAGYL) 500 MG tablet Take 1 tablet (500 mg total) by mouth 2 (two) times daily. 01/05/16   Nona Dell, PA-C    Family History Family History  Problem Relation Age of Onset  . Diabetes Mother   . Heart attack Father     Deceased   . Alcohol abuse Father   . Heart  disease Father   . Hypertension Father   . Mental illness Father   . Breast cancer Maternal Grandmother   . Hearing loss Paternal Grandfather     MI - 64   . Hypertension Paternal Grandfather   . Heart disease Paternal Grandfather   . Stomach cancer Paternal Grandmother     Social History Social History  Substance Use Topics  . Smoking status: Former Smoker    Packs/day: 0.25    Types: Cigarettes    Quit date: 11/08/2012  . Smokeless tobacco: Never Used  . Alcohol use 0.0 oz/week     Comment: rare     Allergies   Dilaudid [hydromorphone hcl] and Sulfa antibiotics   Review of Systems Review of Systems  Gastrointestinal: Positive for abdominal pain,  diarrhea and nausea.  Genitourinary: Positive for dysuria and flank pain.  Musculoskeletal: Positive for back pain.  All other systems reviewed and are negative.    Physical Exam Updated Vital Signs BP 105/73 (BP Location: Left Arm)   Pulse 65   Temp 97.9 F (36.6 C) (Oral)   Resp 18   LMP 12/28/2015 Comment: hcg < 5  SpO2 100%   Physical Exam  Constitutional: She is oriented to person, place, and time. She appears well-developed and well-nourished. No distress.  HENT:  Head: Normocephalic and atraumatic.  Mouth/Throat: Oropharynx is clear and moist. No oropharyngeal exudate.  Eyes: Conjunctivae and EOM are normal. Right eye exhibits no discharge. Left eye exhibits no discharge. No scleral icterus.  Neck: Normal range of motion. Neck supple.  Cardiovascular: Normal rate, regular rhythm, normal heart sounds and intact distal pulses.   Pulmonary/Chest: Effort normal and breath sounds normal. No respiratory distress. She has no wheezes. She has no rales. She exhibits no tenderness.  Abdominal: Soft. Bowel sounds are normal. She exhibits no distension and no mass. There is tenderness (mild TTP over RLQ and LLQ). There is no rebound and no guarding. No hernia.  Mild right CVA tenderness  Musculoskeletal: Normal range of motion. She exhibits no edema.  Neurological: She is alert and oriented to person, place, and time.  Skin: Skin is warm and dry. She is not diaphoretic.  Nursing note and vitals reviewed.  Pelvic exam: normal external genitalia, vulva, vagina, cervix, uterus and adnexa, VULVA: normal appearing vulva with no masses, tenderness or lesions, VAGINA: normal appearing vagina with normal color and discharge, no lesions, small amount of blood noted in vaginal vault, CERVIX: normal appearing cervix without discharge or lesions, WET MOUNT done - results: clue cells, white blood cells, DNA probe for chlamydia and GC obtained, UTERUS: uterus is normal size, shape, consistency and  nontender, ADNEXA: normal adnexa in size, nontender and no masses, exam chaperoned by female tech.   ED Treatments / Results  Labs (all labs ordered are listed, but only abnormal results are displayed) Labs Reviewed  WET PREP, GENITAL - Abnormal; Notable for the following:       Result Value   Clue Cells Wet Prep HPF POC PRESENT (*)    WBC, Wet Prep HPF POC MANY (*)    All other components within normal limits  COMPREHENSIVE METABOLIC PANEL - Abnormal; Notable for the following:    Glucose, Bld 114 (*)    Creatinine, Ser 1.18 (*)    Calcium 8.7 (*)    All other components within normal limits  URINALYSIS, ROUTINE W REFLEX MICROSCOPIC (NOT AT [facility]) - Abnormal; Notable for the following:    Hgb urine dipstick LARGE (*)  All other components within normal limits  URINE MICROSCOPIC-ADD ON - Abnormal; Notable for the following:    Squamous Epithelial / LPF 0-5 (*)    Bacteria, UA RARE (*)    All other components within normal limits  LIPASE, BLOOD  CBC  I-STAT BETA HCG BLOOD, ED ([facility], [facility], [facility] ONLY)  GC/CHLAMYDIA PROBE AMP ([place]) NOT AT [facility]    EKG  EKG Interpretation None       Radiology Ct Renal Stone Study  Result Date: 01/05/2016 CLINICAL DATA:  Right abdominal pain for 5 days EXAM: CT ABDOMEN AND PELVIS WITHOUT CONTRAST TECHNIQUE: Multidetector CT imaging of the abdomen and pelvis was performed following the standard protocol without IV contrast. COMPARISON:  03/20/2015 FINDINGS: Lower chest: No consolidation Hepatobiliary: Postcholecystectomy.  Unremarkable liver. Pancreas: Unremarkable Spleen: Unremarkable Adrenals/Urinary Tract: No hydronephrosis. No urinary calculus. Unremarkable adrenal glands. Stomach/Bowel: Normal appendix best seen on coronal re- constructions see images 55 to 58 of series 5. No obvious mass in the colon. No evidence of small-bowel obstruction. Vascular/Lymphatic: No abnormal retroperitoneal adenopathy. No evidence of aortic aneurysm.  Reproductive: Bladder decompressed. Uterus and adnexa are within normal limits. Other: No free-fluid. Musculoskeletal: No vertebral compression deformity. IMPRESSION: No evidence of urinary obstruction or urinary calculus. Electronically Signed   By: Marybelle Killings M.D.   On: 01/05/2016 08:29    Procedures Procedures (including critical care time)  Medications Ordered in ED Medications  ketorolac (TORADOL) 30 MG/ML injection 30 mg (30 mg Intramuscular Given 01/05/16 0805)     Initial Impression / Assessment and Plan / ED Course  I have reviewed the triage vital signs and the nursing notes.  Pertinent labs & imaging results that were available during my care of the patient were reviewed by me and considered in my medical decision making (see chart for details).  Clinical Course    Patient presents with abdominal pain with associated nausea, diarrhea and dysuria for the past week. Denies history of kidney stones. Notes she is currently on her menstrual cycle. VSS. Exam revealed mild tenderness over right and left lower abdominal quadrants, mild right CVA tenderness. No peritoneal signs. Pelvic exam revealed small amount of blood in vaginal vault, no CMT or adnexal tenderness. Patient denies any nausea at this time and denies any episodes of diarrhea since being in the ED. Patient given Toradol for pain. Pregnancy negative. Labs unremarkable. UA positive for hgb, no signs of infection. Wet prep positive for clue cells. Will order CT renal study to evaluate for kidney stone. CT unremarkable. On reevaluation patient is resting comfortably in bed and reports her symptoms have improved. Patient able to tolerate PO. Discussed results and plan for discharge with patient. Plan to discharge patient home with Flagyl for BV and symptomatically treatment. Advised patient to follow up with PCP. Discussed return precautions.  Final Clinical Impressions(s) / ED Diagnoses   Final diagnoses:  Abdominal pain,  unspecified abdominal location  BV (bacterial vaginosis)    New Prescriptions New Prescriptions   METRONIDAZOLE (FLAGYL) 500 MG TABLET    Take 1 tablet (500 mg total) by mouth 2 (two) times daily.     Chesley Noon [place], [place] 01/05/16 [id number]    April Palumbo, MD 01/05/16 [phone]

## 2016-01-07 LAB — GC/CHLAMYDIA PROBE AMP (~~LOC~~) NOT AT ARMC
Chlamydia: NEGATIVE
Neisseria Gonorrhea: NEGATIVE

## 2016-03-13 DIAGNOSIS — R350 Frequency of micturition: Secondary | ICD-10-CM | POA: Diagnosis not present

## 2016-03-13 DIAGNOSIS — N3020 Other chronic cystitis without hematuria: Secondary | ICD-10-CM | POA: Diagnosis not present

## 2016-03-16 DIAGNOSIS — J0140 Acute pansinusitis, unspecified: Secondary | ICD-10-CM | POA: Diagnosis not present

## 2016-03-16 DIAGNOSIS — J029 Acute pharyngitis, unspecified: Secondary | ICD-10-CM | POA: Diagnosis not present

## 2016-04-24 DIAGNOSIS — N3020 Other chronic cystitis without hematuria: Secondary | ICD-10-CM | POA: Diagnosis not present

## 2016-05-23 ENCOUNTER — Ambulatory Visit (INDEPENDENT_AMBULATORY_CARE_PROVIDER_SITE_OTHER): Payer: 59 | Admitting: Family Medicine

## 2016-05-23 ENCOUNTER — Encounter: Payer: Self-pay | Admitting: Family Medicine

## 2016-05-23 VITALS — BP 124/78 | HR 88 | Temp 98.1°F | Ht 70.5 in | Wt 274.6 lb

## 2016-05-23 DIAGNOSIS — R21 Rash and other nonspecific skin eruption: Secondary | ICD-10-CM | POA: Diagnosis not present

## 2016-05-23 MED ORDER — METHYLPREDNISOLONE ACETATE 80 MG/ML IJ SUSP
80.0000 mg | Freq: Once | INTRAMUSCULAR | Status: AC
  Administered 2016-05-23: 80 mg via INTRAMUSCULAR

## 2016-05-23 NOTE — Progress Notes (Signed)
Subjective:    Patient ID: Deanna Norton, female    DOB: [birth year], 31 y.o.   MRN: [id number]  HPI This is a 31 yo female, accompanied by her 2/12 yo daughter, who presents today with new onset rash. Never had similar rash. No new food, soaps, detergents. No facial swelling or difficulty swallowing. Very puritic. Started around hairline 3 days ago and has gotten progressively worse. Used new shampoo prior to symptoms. Worse with hot shower. Took a Benadryl 30 minutes ago.  Finished Macrobid about 2 weeks ago for UTI.  Can not tolerate prednisone, makes her very anxious and jittery, has had a steroid injection last winter and tolerated fine.   Past Medical History:  Diagnosis Date  . Anxiety   . Asthma   . C. difficile diarrhea 2014   Completed Flagyl  . Kidney infection 07/27/12  . Ovarian cyst   . PONV (postoperative nausea and vomiting)   . Stomach ulcer    in college   Past Surgical History:  Procedure Laterality Date  . CHOLECYSTECTOMY  Dec 2015    Family History  Problem Relation Age of Onset  . Diabetes Mother   . Heart attack Father     Deceased   . Alcohol abuse Father   . Heart disease Father   . Hypertension Father   . Mental illness Father   . Breast cancer Maternal Grandmother   . Hearing loss Paternal Grandfather     MI - 35   . Hypertension Paternal Grandfather   . Heart disease Paternal Grandfather   . Stomach cancer Paternal Grandmother    Social History  Substance Use Topics  . Smoking status: Former Smoker    Packs/day: 0.25    Types: Cigarettes    Quit date: 11/08/2012  . Smokeless tobacco: Never Used  . Alcohol use 0.0 oz/week     Comment: rare      Review of Systems Per HPI    Objective:   Physical Exam  Constitutional: She is oriented to person, place, and time. She appears well-developed and well-nourished. No distress.  HENT:  Head: Normocephalic and atraumatic.  Mouth/Throat: Uvula is midline, oropharynx is clear and moist and  mucous membranes are normal.  No swelling of face or lips.   Eyes: Conjunctivae are normal.  Neck: Normal range of motion. Neck supple.  Cardiovascular: Normal rate, regular rhythm and normal heart sounds.   Pulmonary/Chest: Effort normal and breath sounds normal. No respiratory distress. She has no wheezes. She has no rales.  Musculoskeletal: Normal range of motion.  Neurological: She is alert and oriented to person, place, and time.  Skin: Skin is warm and dry. Rash (diffuse erythematous papular rash and evidence of scratching on neck, back, arrms. ) noted. She is not diaphoretic.  Psychiatric: She has a normal mood and affect. Her behavior is normal. Judgment and thought content normal.  Vitals reviewed.     BP 124/78   Pulse 88   Temp 98.1 F (36.7 C) (Oral)   Ht 5' 10.5" (1.791 m)   Wt 274 lb 9.6 oz (124.6 kg)   SpO2 98%   BMI 38.84 kg/m  Wt Readings from Last 3 Encounters:  05/23/16 274 lb 9.6 oz (124.6 kg)  06/13/15 255 lb (115.7 kg)  03/30/15 248 lb 4 oz (112.6 kg)       Assessment & Plan:  1. Rash of entire body - unknown trigger, encouraged her to stop supplements and reintroduce when rash resolved - Provided  written and verbal information regarding diagnosis and treatment. -  Patient Instructions  Thank you for coming in today, I hope you are feeling better soon! For next 7 days, please take Zyrtec (generic is fine) daily and ranitidine 150 mg twice a day. Can take benadryl at bedtime if needed for sleep. If you develop any swelling of your face, throat, or lips or any difficulty breathing call 911, or go to ER.  Avoid getting over heated, take cool showers/baths.  - methylPREDNISolone acetate (DEPO-MEDROL) injection 80 mg; Inject 1 mL (80 mg total) into the muscle once.   Clarene Reamer, FNP-BC  [facility] [place], [facility]  05/23/2016 1:57 PM

## 2016-05-23 NOTE — Patient Instructions (Addendum)
Thank you for coming in today, I hope you are feeling better soon! For next 7 days, please take Zyrtec (generic is fine) daily and ranitidine 150 mg twice a day. Can take benadryl at bedtime if needed for sleep. If you develop any swelling of your face, throat, or lips or any difficulty breathing call 911, or go to ER.  Avoid getting over heated, take cool showers/baths.

## 2016-05-23 NOTE — Progress Notes (Signed)
Pre visit review using our clinic review tool, if applicable. No additional management support is needed unless otherwise documented below in the visit note. 

## 2016-06-02 DIAGNOSIS — J4520 Mild intermittent asthma, uncomplicated: Secondary | ICD-10-CM | POA: Diagnosis not present

## 2016-06-02 DIAGNOSIS — J309 Allergic rhinitis, unspecified: Secondary | ICD-10-CM | POA: Diagnosis not present

## 2016-06-02 DIAGNOSIS — L509 Urticaria, unspecified: Secondary | ICD-10-CM | POA: Diagnosis not present

## 2016-06-02 DIAGNOSIS — J3089 Other allergic rhinitis: Secondary | ICD-10-CM | POA: Diagnosis not present

## 2016-07-23 DIAGNOSIS — N3000 Acute cystitis without hematuria: Secondary | ICD-10-CM | POA: Diagnosis not present

## 2016-07-28 DIAGNOSIS — N911 Secondary amenorrhea: Secondary | ICD-10-CM | POA: Diagnosis not present

## 2016-08-05 LAB — OB RESULTS CONSOLE HEPATITIS B SURFACE ANTIGEN: HEP B S AG: NEGATIVE

## 2016-08-05 LAB — OB RESULTS CONSOLE ABO/RH: RH Type: POSITIVE

## 2016-08-05 LAB — OB RESULTS CONSOLE RPR: RPR: NONREACTIVE

## 2016-08-05 LAB — OB RESULTS CONSOLE HIV ANTIBODY (ROUTINE TESTING): HIV: NONREACTIVE

## 2016-08-05 LAB — OB RESULTS CONSOLE GC/CHLAMYDIA: GC PROBE AMP, GENITAL: NEGATIVE

## 2016-08-05 LAB — OB RESULTS CONSOLE ANTIBODY SCREEN: Antibody Screen: NEGATIVE

## 2016-08-05 LAB — OB RESULTS CONSOLE RUBELLA ANTIBODY, IGM: RUBELLA: IMMUNE

## 2016-08-19 ENCOUNTER — Other Ambulatory Visit: Payer: Self-pay | Admitting: Advanced Practice Midwife

## 2016-08-19 ENCOUNTER — Inpatient Hospital Stay (HOSPITAL_COMMUNITY): Payer: 59

## 2016-08-19 ENCOUNTER — Encounter (HOSPITAL_COMMUNITY): Payer: Self-pay

## 2016-08-19 ENCOUNTER — Telehealth (HOSPITAL_COMMUNITY): Payer: Self-pay | Admitting: *Deleted

## 2016-08-19 ENCOUNTER — Inpatient Hospital Stay (HOSPITAL_COMMUNITY)
Admission: AD | Admit: 2016-08-19 | Discharge: 2016-08-19 | Disposition: A | Discharge status: Home / Self Care | Payer: 59 | Source: Ambulatory Visit | Attending: Obstetrics and Gynecology | Admitting: Obstetrics and Gynecology

## 2016-08-19 ENCOUNTER — Telehealth: Payer: Self-pay | Admitting: Advanced Practice Midwife

## 2016-08-19 DIAGNOSIS — R102 Pelvic and perineal pain: Secondary | ICD-10-CM | POA: Insufficient documentation

## 2016-08-19 DIAGNOSIS — N83209 Unspecified ovarian cyst, unspecified side: Secondary | ICD-10-CM | POA: Insufficient documentation

## 2016-08-19 DIAGNOSIS — Z87891 Personal history of nicotine dependence: Secondary | ICD-10-CM | POA: Insufficient documentation

## 2016-08-19 DIAGNOSIS — O99342 Other mental disorders complicating pregnancy, second trimester: Secondary | ICD-10-CM | POA: Diagnosis not present

## 2016-08-19 DIAGNOSIS — Z79899 Other long term (current) drug therapy: Secondary | ICD-10-CM | POA: Insufficient documentation

## 2016-08-19 DIAGNOSIS — R300 Dysuria: Secondary | ICD-10-CM | POA: Diagnosis not present

## 2016-08-19 DIAGNOSIS — O3481 Maternal care for other abnormalities of pelvic organs, first trimester: Secondary | ICD-10-CM | POA: Insufficient documentation

## 2016-08-19 DIAGNOSIS — Z3A11 11 weeks gestation of pregnancy: Secondary | ICD-10-CM | POA: Insufficient documentation

## 2016-08-19 DIAGNOSIS — Z3490 Encounter for supervision of normal pregnancy, unspecified, unspecified trimester: Secondary | ICD-10-CM

## 2016-08-19 DIAGNOSIS — O26891 Other specified pregnancy related conditions, first trimester: Secondary | ICD-10-CM | POA: Diagnosis not present

## 2016-08-19 DIAGNOSIS — J45909 Unspecified asthma, uncomplicated: Secondary | ICD-10-CM | POA: Diagnosis not present

## 2016-08-19 DIAGNOSIS — O99512 Diseases of the respiratory system complicating pregnancy, second trimester: Secondary | ICD-10-CM | POA: Insufficient documentation

## 2016-08-19 DIAGNOSIS — F419 Anxiety disorder, unspecified: Secondary | ICD-10-CM | POA: Diagnosis not present

## 2016-08-19 LAB — WET PREP, GENITAL
CLUE CELLS WET PREP: NONE SEEN
SPERM: NONE SEEN
Trich, Wet Prep: NONE SEEN
Yeast Wet Prep HPF POC: NONE SEEN

## 2016-08-19 LAB — URINALYSIS, ROUTINE W REFLEX MICROSCOPIC
BILIRUBIN URINE: NEGATIVE
Glucose, UA: NEGATIVE mg/dL
Hgb urine dipstick: NEGATIVE
Ketones, ur: NEGATIVE mg/dL
Nitrite: NEGATIVE
Protein, ur: NEGATIVE mg/dL
SPECIFIC GRAVITY, URINE: 1.026 (ref 1.005–1.030)
pH: 5 (ref 5.0–8.0)

## 2016-08-19 LAB — CBC
HCT: 40.1 % (ref 36.0–46.0)
Hemoglobin: 13.7 g/dL (ref 12.0–15.0)
MCH: 29.1 pg (ref 26.0–34.0)
MCHC: 34.2 g/dL (ref 30.0–36.0)
MCV: 85.1 fL (ref 78.0–100.0)
Platelets: 221 10*3/uL (ref 150–400)
RBC: 4.71 MIL/uL (ref 3.87–5.11)
RDW: 13.2 % (ref 11.5–15.5)
WBC: 5.8 10*3/uL (ref 4.0–10.5)

## 2016-08-19 LAB — POCT PREGNANCY, URINE: PREG TEST UR: POSITIVE — AB

## 2016-08-19 LAB — HCG, QUANTITATIVE, PREGNANCY: HCG, BETA CHAIN, QUANT, S: 54923 m[IU]/mL — AB (ref <5)

## 2016-08-19 LAB — GC/CHLAMYDIA PROBE AMP (~~LOC~~) NOT AT ARMC
Chlamydia: NEGATIVE
Neisseria Gonorrhea: NEGATIVE

## 2016-08-19 MED ORDER — PHENAZOPYRIDINE HCL 100 MG PO TABS: 100.0000 mg | ORAL_TABLET | Freq: Three times a day (TID) | ORAL | 0 refills | Status: DC | PRN

## 2016-08-19 MED ORDER — CEPHALEXIN 500 MG PO CAPS: 500.0000 mg | ORAL_CAPSULE | Freq: Four times a day (QID) | ORAL | 0 refills | Status: DC

## 2016-08-19 MED ORDER — NITROFURANTOIN MONOHYD MACRO 100 MG PO CAPS: 100.0000 mg | ORAL_CAPSULE | Freq: Two times a day (BID) | ORAL | 0 refills | Status: DC

## 2016-08-19 NOTE — MAU Provider Note (Signed)
History     CSN: [id number]  Arrival date and time: 08/19/16 0456   First Provider Initiated Contact with Patient 08/19/16 0548      Chief Complaint  Patient presents with  . Pelvic Pain   Deanna Norton is a 31 y.o. G2P1001 at [redacted]w[redacted]d who presents today with dysuria, frequency and pelvic pain. She reports that she has had frequent UTIs and a history of interstitial cystitis. She states that generally a interstitial cystitis flare does not feel how she is feeling today. She denies any VB or vaginal dischagre.    Pelvic Pain  The patient's primary symptoms include pelvic pain. The patient's pertinent negatives include no vaginal discharge. This is a new problem. The current episode started today. The problem occurs constantly. The problem has been unchanged. Pain severity now: 7/10. The problem affects both sides. She is pregnant. Associated symptoms include dysuria, frequency, nausea and urgency. Pertinent negatives include no chills, fever or vomiting. The vaginal discharge was normal. There has been no bleeding. She has not been passing clots. She has not been passing tissue. Nothing aggravates the symptoms. She has tried nothing for the symptoms. Her menstrual history has been regular (LMP: 06/07/16 ).   Past Medical History:  Diagnosis Date  . Anxiety   . Asthma   . C. difficile diarrhea 2014   Completed Flagyl  . Kidney infection 07/27/12  . Ovarian cyst   . PONV (postoperative nausea and vomiting)   . Stomach ulcer    in college    Past Surgical History:  Procedure Laterality Date  . CHOLECYSTECTOMY  Dec 2015     Family History  Problem Relation Age of Onset  . Diabetes Mother   . Heart attack Father        Deceased   . Alcohol abuse Father   . Heart disease Father   . Hypertension Father   . Mental illness Father   . Breast cancer Maternal Grandmother   . Hearing loss Paternal Grandfather        MI - 49   . Hypertension Paternal Grandfather   . Heart disease  Paternal Grandfather   . Stomach cancer Paternal Grandmother     Social History  Substance Use Topics  . Smoking status: Former Smoker    Packs/day: 0.25    Types: Cigarettes    Quit date: 11/08/2012  . Smokeless tobacco: Never Used  . Alcohol use 0.0 oz/week     Comment: rare    Allergies:  Allergies  Allergen Reactions  . Dilaudid [Hydromorphone Hcl] Shortness Of Breath    IV dilaudid causes nerve & muscle burning  . Sulfa Antibiotics Hives    Prescriptions Prior to Admission  Medication Sig Dispense Refill Last Dose  . albuterol (PROVENTIL HFA;VENTOLIN HFA) 108 (90 BASE) MCG/ACT inhaler Inhale 2 puffs into the lungs every 4 (four) hours as needed for wheezing or shortness of breath. 1 Inhaler 2 Taking  . Cholecalciferol (VITAMIN D) 2000 units CAPS Take 1 capsule by mouth daily.   Taking  . ibuprofen (ADVIL,MOTRIN) 800 MG tablet Take 1 tablet (800 mg total) by mouth every 8 (eight) hours as needed for moderate pain. 30 tablet 1 Taking  . Prenatal Vit-Fe Fumarate-FA (MULTIVITAMIN-PRENATAL) 27-0.8 MG TABS tablet Take 1 tablet by mouth daily at 12 noon.   Taking  . Probiotic Product (PROBIOTIC-10 PO) Take by mouth.   Taking    Review of Systems  Constitutional: Negative for chills and fever.  Gastrointestinal: Positive for nausea.  Negative for vomiting.  Genitourinary: Positive for dysuria, frequency, pelvic pain and urgency. Negative for vaginal bleeding and vaginal discharge.   Physical Exam   Blood pressure 115/80, pulse (!) 113, temperature 98.3 F (36.8 C), temperature source Oral, resp. rate 20, height 5\' 10"  (1.778 m), weight 254 lb (115.2 kg), last menstrual period 06/07/2016, SpO2 100 %.  Physical Exam  Nursing note and vitals reviewed. Constitutional: She is oriented to person, place, and time. She appears well-developed and well-nourished. No distress.  HENT:  Head: Normocephalic.  Cardiovascular: Normal rate.   Respiratory: Effort normal.  GI: Soft. There is  no tenderness. There is no rebound.  Genitourinary:  Genitourinary Comments: No CVA tenderness   Neurological: She is alert and oriented to person, place, and time.  Skin: Skin is warm and dry.  Psychiatric: She has a normal mood and affect.   Results for orders placed or performed during the hospital encounter of 08/19/16 (from the past 24 hour(s))  Urinalysis, Routine w reflex microscopic     Status: Abnormal   Collection Time: 08/19/16  5:00 AM  Result Value Ref Range   Color, Urine YELLOW YELLOW   APPearance CLEAR CLEAR   Specific Gravity, Urine 1.026 1.005 - 1.030   pH 5.0 5.0 - 8.0   Glucose, UA NEGATIVE NEGATIVE mg/dL   Hgb urine dipstick NEGATIVE NEGATIVE   Bilirubin Urine NEGATIVE NEGATIVE   Ketones, ur NEGATIVE NEGATIVE mg/dL   Protein, ur NEGATIVE NEGATIVE mg/dL   Nitrite NEGATIVE NEGATIVE   Leukocytes, UA TRACE (A) NEGATIVE   RBC / HPF 0-5 0 - 5 RBC/hpf   WBC, UA 0-5 0 - 5 WBC/hpf   Bacteria, UA RARE (A) NONE SEEN   Squamous Epithelial / LPF 0-5 (A) NONE SEEN   Mucous PRESENT   Pregnancy, urine POC     Status: Abnormal   Collection Time: 08/19/16  5:17 AM  Result Value Ref Range   Preg Test, Ur POSITIVE (A) NEGATIVE  Wet prep, genital     Status: Abnormal   Collection Time: 08/19/16  5:55 AM  Result Value Ref Range   Yeast Wet Prep HPF POC NONE SEEN NONE SEEN   Trich, Wet Prep NONE SEEN NONE SEEN   Clue Cells Wet Prep HPF POC NONE SEEN NONE SEEN   WBC, Wet Prep HPF POC FEW (A) NONE SEEN   Sperm NONE SEEN    US Ob Comp Less 14 Wks  Result Date: 08/19/2016 CLINICAL DATA:  Acute onset of pelvic pain.  Initial encounter. EXAM: OBSTETRIC <14 WK ULTRASOUND TECHNIQUE: Transabdominal ultrasound was performed for evaluation of the gestation as well as the maternal uterus and adnexal regions. COMPARISON:  CT of the abdomen and pelvis from 01/05/2016 FINDINGS: Intrauterine gestational sac: Single; visualized and normal in shape. Yolk sac:  No Embryo:  Yes Cardiac Activity:  Yes Heart Rate: 158 bpm CRL:   4.14 cm   11 w 0 d                  [place] EDC: 03/10/2017 Subchorionic hemorrhage:  None visualized. Maternal uterus/adnexae: The uterus is otherwise unremarkable in appearance. The ovaries are within normal limits. The right ovary measures 2.5 x 1.7 x 1.7 cm, while the left ovary measures 2.8 x 1.5 x 1.9 cm. No suspicious adnexal masses are seen; there is no evidence for ovarian torsion. No free fluid is seen within the pelvic cul-de-sac. IMPRESSION: Single live intrauterine pregnancy noted, with a crown-rump length of 4.1 cm, corresponding  to a gestational age of [redacted] weeks 0 days. This matches the gestational age of [redacted] weeks 3 days by LMP, reflecting an estimated date of delivery of March 14, 2017. Electronically Signed   By: Garald Balding M.D.   On: 08/19/2016 06:38   MAU Course  Procedures  MDM D/W patient that UA is normal today. We will send for urine culture. I will dc patient home with rx for antibiotic, and have the patient decide if she wants to start taking it right away or wait for the culture results. She states that she is leaning towards taking it as she is uncomfortable at this time.   Assessment and Plan   1. Dysuria during pregnancy in first trimester   2. Pelvic pain in pregnancy, antepartum, first trimester   3. [redacted] weeks gestation of pregnancy   4. Intrauterine pregnancy    DC home Comfort measures reviewed  1st Trimester precautions  UC/GC/CT pending  Please call with urine culture results as patient may not have started the antibiotic  RX: keflex 500mg  QIDx 7, pyridium PRN Return to MAU as needed FU with OB as planned  Follow-up Information    Louretta Shorten, MD Follow up.   Specialty:  Obstetrics and Gynecology Contact information: [place], [place] [address] [phone]            Marcille Buffy 08/19/2016, 5:49 AM

## 2016-08-19 NOTE — Discharge Instructions (Signed)

## 2016-08-19 NOTE — MAU Note (Signed)
Pt here with c/o back pain, pain with urination, increased frequency.

## 2016-08-19 NOTE — Telephone Encounter (Signed)
Pt called to request change from Keflex to [facility] for UTI. Pt has sensitivity to Keflex and does not want this medication.  Macrobid 100 mg BID x 7 days sent to [facility] [employer] on [address]  Discussed safety of pyridium with patient and questions answered.  Urine culture pending.

## 2016-08-20 LAB — CULTURE, OB URINE

## 2016-10-06 DIAGNOSIS — D485 Neoplasm of uncertain behavior of skin: Secondary | ICD-10-CM | POA: Diagnosis not present

## 2016-10-06 DIAGNOSIS — D2239 Melanocytic nevi of other parts of face: Secondary | ICD-10-CM | POA: Diagnosis not present

## 2016-11-28 ENCOUNTER — Encounter: Payer: Self-pay | Admitting: Family Medicine

## 2016-12-17 DIAGNOSIS — Z23 Encounter for immunization: Secondary | ICD-10-CM | POA: Diagnosis not present

## 2016-12-23 DIAGNOSIS — Z8619 Personal history of other infectious and parasitic diseases: Secondary | ICD-10-CM | POA: Diagnosis not present

## 2016-12-23 DIAGNOSIS — Z9049 Acquired absence of other specified parts of digestive tract: Secondary | ICD-10-CM | POA: Diagnosis not present

## 2017-02-18 LAB — OB RESULTS CONSOLE GBS
GBS: NEGATIVE
STREP GROUP B AG: NEGATIVE

## 2017-03-10 NOTE — L&D Delivery Note (Signed)
Delivery Note At 1:10 AM a viable female was delivered via Vaginal, Spontaneous (Presentation: ROA).  APGAR: 9, 9; weight pending.   Placenta status: S, I. 3V Cord with the following complications: none.  Cord pH: n/a  Anesthesia:  CLEA Episiotomy: None Lacerations: None Suture Repair: n/a Est. Blood Loss (mL): 150  Mom to postpartum.  Baby to Couplet care / Skin to Skin.  Deanna Norton 03/20/2017, 1:22 AM

## 2017-03-16 ENCOUNTER — Encounter (HOSPITAL_COMMUNITY): Payer: Self-pay | Admitting: *Deleted

## 2017-03-16 ENCOUNTER — Telehealth (HOSPITAL_COMMUNITY): Payer: Self-pay | Admitting: *Deleted

## 2017-03-16 NOTE — Telephone Encounter (Signed)
Preadmission screen  

## 2017-03-17 ENCOUNTER — Telehealth (HOSPITAL_COMMUNITY): Payer: Self-pay | Admitting: *Deleted

## 2017-03-17 ENCOUNTER — Encounter (HOSPITAL_COMMUNITY): Payer: Self-pay | Admitting: *Deleted

## 2017-03-17 NOTE — Telephone Encounter (Signed)
Preadmission screen  

## 2017-03-18 ENCOUNTER — Inpatient Hospital Stay (EMERGENCY_DEPARTMENT_HOSPITAL)
Admission: AD | Admit: 2017-03-18 | Discharge: 2017-03-19 | Disposition: A | Discharge status: Home / Self Care | Payer: 59 | Source: Ambulatory Visit | Attending: Obstetrics and Gynecology | Admitting: Obstetrics and Gynecology

## 2017-03-18 DIAGNOSIS — O479 False labor, unspecified: Secondary | ICD-10-CM

## 2017-03-19 ENCOUNTER — Inpatient Hospital Stay (HOSPITAL_COMMUNITY)
Admission: AD | Admit: 2017-03-19 | Discharge: 2017-03-21 | DRG: 807 | Disposition: A | Discharge status: Home / Self Care | Payer: 59 | Source: Ambulatory Visit | Attending: Obstetrics & Gynecology | Admitting: Obstetrics & Gynecology

## 2017-03-19 ENCOUNTER — Inpatient Hospital Stay (HOSPITAL_COMMUNITY): Payer: 59 | Admitting: Anesthesiology

## 2017-03-19 ENCOUNTER — Encounter (HOSPITAL_COMMUNITY): Payer: Self-pay | Admitting: Emergency Medicine

## 2017-03-19 ENCOUNTER — Encounter (HOSPITAL_COMMUNITY): Payer: Self-pay

## 2017-03-19 DIAGNOSIS — Z3490 Encounter for supervision of normal pregnancy, unspecified, unspecified trimester: Secondary | ICD-10-CM

## 2017-03-19 DIAGNOSIS — Z3A40 40 weeks gestation of pregnancy: Secondary | ICD-10-CM | POA: Diagnosis not present

## 2017-03-19 DIAGNOSIS — Z87891 Personal history of nicotine dependence: Secondary | ICD-10-CM

## 2017-03-19 DIAGNOSIS — O471 False labor at or after 37 completed weeks of gestation: Secondary | ICD-10-CM | POA: Diagnosis not present

## 2017-03-19 DIAGNOSIS — Z3483 Encounter for supervision of other normal pregnancy, third trimester: Secondary | ICD-10-CM | POA: Diagnosis not present

## 2017-03-19 DIAGNOSIS — O480 Post-term pregnancy: Secondary | ICD-10-CM | POA: Diagnosis not present

## 2017-03-19 LAB — CBC
HEMATOCRIT: 44 % (ref 36.0–46.0)
Hemoglobin: 14.7 g/dL (ref 12.0–15.0)
MCH: 28.9 pg (ref 26.0–34.0)
MCHC: 33.4 g/dL (ref 30.0–36.0)
MCV: 86.4 fL (ref 78.0–100.0)
Platelets: 288 10*3/uL (ref 150–400)
RBC: 5.09 MIL/uL (ref 3.87–5.11)
RDW: 13.4 % (ref 11.5–15.5)
WBC: 16.5 10*3/uL — ABNORMAL HIGH (ref 4.0–10.5)

## 2017-03-19 LAB — TYPE AND SCREEN
ABO/RH(D): A POS
ANTIBODY SCREEN: NEGATIVE

## 2017-03-19 MED ORDER — ACETAMINOPHEN 325 MG PO TABS: 650.0000 mg | ORAL_TABLET | ORAL | Status: DC | PRN

## 2017-03-19 MED ORDER — LIDOCAINE HCL (PF) 1 % IJ SOLN
INTRAMUSCULAR | Status: DC | PRN
  Administered 2017-03-19: 6 mL via EPIDURAL
  Administered 2017-03-19: 4 mL

## 2017-03-19 MED ORDER — FENTANYL CITRATE (PF) 100 MCG/2ML IJ SOLN: 50.0000 ug | INTRAMUSCULAR | Status: DC | PRN

## 2017-03-19 MED ORDER — EPHEDRINE 5 MG/ML INJ
10.0000 mg | INTRAVENOUS | Status: DC | PRN
  Filled 2017-03-19: qty 2

## 2017-03-19 MED ORDER — LACTATED RINGERS IV SOLN
500.0000 mL | Freq: Once | INTRAVENOUS | Status: AC
  Administered 2017-03-19: 500 mL via INTRAVENOUS

## 2017-03-19 MED ORDER — FLEET ENEMA 7-19 GM/118ML RE ENEM: 1.0000 | ENEMA | RECTAL | Status: DC | PRN

## 2017-03-19 MED ORDER — PHENYLEPHRINE 40 MCG/ML (10ML) SYRINGE FOR IV PUSH (FOR BLOOD PRESSURE SUPPORT)
80.0000 ug | PREFILLED_SYRINGE | INTRAVENOUS | Status: DC | PRN
  Filled 2017-03-19: qty 10
  Filled 2017-03-19: qty 5

## 2017-03-19 MED ORDER — PHENYLEPHRINE 40 MCG/ML (10ML) SYRINGE FOR IV PUSH (FOR BLOOD PRESSURE SUPPORT)
80.0000 ug | PREFILLED_SYRINGE | INTRAVENOUS | Status: DC | PRN
  Administered 2017-03-19 (×2): 80 ug via INTRAVENOUS
  Filled 2017-03-19: qty 5

## 2017-03-19 MED ORDER — FENTANYL 2.5 MCG/ML BUPIVACAINE 1/10 % EPIDURAL INFUSION (WH - ANES)
14.0000 mL/h | INTRAMUSCULAR | Status: DC | PRN
  Administered 2017-03-19: 16 mL/h via EPIDURAL
  Filled 2017-03-19: qty 100

## 2017-03-19 MED ORDER — OXYTOCIN BOLUS FROM INFUSION
500.0000 mL | Freq: Once | INTRAVENOUS | Status: AC
  Administered 2017-03-20: 500 mL via INTRAVENOUS

## 2017-03-19 MED ORDER — SOD CITRATE-CITRIC ACID 500-334 MG/5ML PO SOLN: 30.0000 mL | ORAL | Status: DC | PRN

## 2017-03-19 MED ORDER — ONDANSETRON HCL 4 MG/2ML IJ SOLN: 4.0000 mg | Freq: Four times a day (QID) | INTRAMUSCULAR | Status: DC | PRN

## 2017-03-19 MED ORDER — LACTATED RINGERS IV SOLN: 500.0000 mL | INTRAVENOUS | Status: DC | PRN

## 2017-03-19 MED ORDER — LIDOCAINE HCL (PF) 1 % IJ SOLN
30.0000 mL | INTRAMUSCULAR | Status: DC | PRN
  Filled 2017-03-19: qty 30

## 2017-03-19 MED ORDER — LACTATED RINGERS IV SOLN
INTRAVENOUS | Status: DC
  Administered 2017-03-19: 23:00:00 via INTRAVENOUS

## 2017-03-19 MED ORDER — OXYTOCIN 40 UNITS IN LACTATED RINGERS INFUSION - SIMPLE MED
2.5000 [IU]/h | INTRAVENOUS | Status: DC
  Filled 2017-03-19: qty 1000

## 2017-03-19 MED ORDER — DIPHENHYDRAMINE HCL 50 MG/ML IJ SOLN: 12.5000 mg | INTRAMUSCULAR | Status: DC | PRN

## 2017-03-19 NOTE — MAU Note (Signed)
Urine is in lab

## 2017-03-19 NOTE — MAU Note (Signed)
Pt contracting 5-6 apart. Denies LOF or bleeding. + FM

## 2017-03-19 NOTE — Anesthesia Preprocedure Evaluation (Signed)
Anesthesia Evaluation  Patient identified by MRN, date of birth, ID band Patient awake    Reviewed: Allergy & Precautions, H&P , Patient's Chart, lab work & pertinent test results  Airway Mallampati: II  TM Distance: >3 FB Neck ROM: full    Dental  (+) Teeth Intact   Pulmonary asthma , former smoker,    breath sounds clear to auscultation       Cardiovascular  Rhythm:regular Rate:Normal     Neuro/Psych    GI/Hepatic   Endo/Other    Renal/GU      Musculoskeletal   Abdominal   Peds  Hematology   Anesthesia Other Findings       Reproductive/Obstetrics (+) Pregnancy                             Anesthesia Physical Anesthesia Plan  ASA: III  Anesthesia Plan: Epidural   Post-op Pain Management:    Induction:   PONV Risk Score and Plan:   Airway Management Planned:   Additional Equipment:   Intra-op Plan:   Post-operative Plan:   Informed Consent: I have reviewed the patients History and Physical, chart, labs and discussed the procedure including the risks, benefits and alternatives for the proposed anesthesia with the patient or authorized representative who has indicated his/her understanding and acceptance.   Dental Advisory Given  Plan Discussed with:   Anesthesia Plan Comments: (Labs checked- platelets confirmed with RN in room. Fetal heart tracing, per RN, reported to be stable enough for sitting procedure. Discussed epidural, and patient consents to the procedure:  included risk of possible headache,backache, failed block, allergic reaction, and nerve injury. This patient was asked if she had any questions or concerns before the procedure started.)        Anesthesia Quick Evaluation

## 2017-03-19 NOTE — Discharge Instructions (Signed)
Braxton Hicks Contractions °Contractions of the uterus can occur throughout pregnancy, but they are not always a sign that you are in labor. You may have practice contractions called Braxton Hicks contractions. These false labor contractions are sometimes confused with true labor. °What are Braxton Hicks contractions? °Braxton Hicks contractions are tightening movements that occur in the muscles of the uterus before labor. Unlike true labor contractions, these contractions do not result in opening (dilation) and thinning of the cervix. Toward the end of pregnancy (32-34 weeks), Braxton Hicks contractions can happen more often and may become stronger. These contractions are sometimes difficult to tell apart from true labor because they can be very uncomfortable. You should not feel embarrassed if you go to the hospital with false labor. °Sometimes, the only way to tell if you are in true labor is for your health care provider to look for changes in the cervix. The health care provider will do a physical exam and may monitor your contractions. If you are not in true labor, the exam should show that your cervix is not dilating and your water has not broken. °If there are other health problems associated with your pregnancy, it is completely safe for you to be sent home with false labor. You may continue to have Braxton Hicks contractions until you go into true labor. °How to tell the difference between true labor and false labor °True labor °· Contractions last 30-70 seconds. °· Contractions become very regular. °· Discomfort is usually felt in the top of the uterus, and it spreads to the lower abdomen and low back. °· Contractions do not go away with walking. °· Contractions usually become more intense and increase in frequency. °· The cervix dilates and gets thinner. °False labor °· Contractions are usually shorter and not as strong as true labor contractions. °· Contractions are usually irregular. °· Contractions  are often felt in the front of the lower abdomen and in the groin. °· Contractions may go away when you walk around or change positions while lying down. °· Contractions get weaker and are shorter-lasting as time goes on. °· The cervix usually does not dilate or become thin. °Follow these instructions at home: °· Take over-the-counter and prescription medicines only as told by your health care provider. °· Keep up with your usual exercises and follow other instructions from your health care provider. °· Eat and drink lightly if you think you are going into labor. °· If Braxton Hicks contractions are making you uncomfortable: °? Change your position from lying down or resting to walking, or change from walking to resting. °? Sit and rest in a tub of warm water. °? Drink enough fluid to keep your urine pale yellow. Dehydration may cause these contractions. °? Do slow and deep breathing several times an hour. °· Keep all follow-up prenatal visits as told by your health care provider. This is important. °Contact a health care provider if: °· You have a fever. °· You have continuous pain in your abdomen. °Get help right away if: °· Your contractions become stronger, more regular, and closer together. °· You have fluid leaking or gushing from your vagina. °· You pass blood-tinged mucus (bloody show). °· You have bleeding from your vagina. °· You have low back pain that you never had before. °· You feel your baby’s head pushing down and causing pelvic pressure. °· Your baby is not moving inside you as much as it used to. °Summary °· Contractions that occur before labor are called Braxton   Hicks contractions, false labor, or practice contractions. °· Braxton Hicks contractions are usually shorter, weaker, farther apart, and less regular than true labor contractions. True labor contractions usually become progressively stronger and regular and they become more frequent. °· Manage discomfort from Braxton Hicks contractions by  changing position, resting in a warm bath, drinking plenty of water, or practicing deep breathing. °This information is not intended to replace advice given to you by your health care provider. Make sure you discuss any questions you have with your health care provider. °Document Released: 07/10/2016 Document Revised: 07/10/2016 Document Reviewed: 07/10/2016 °Elsevier Interactive Patient Education © 2018 [employer] ° °

## 2017-03-19 NOTE — MAU Provider Note (Signed)
Chief Complaint:  Labor Eval   NST reviewed at 0115   HPI  HPI: Deanna Norton is a 32 y.o. G2P1001 at 36w5dwho presents to maternity admissions reporting uterine contractions. Labor evaluation done by RN . I was asked to review NST and discharge patient.   Past Medical History: Past Medical History:  Diagnosis Date  . Anxiety   . Asthma   . C. difficile diarrhea 2014   Completed Flagyl  . Kidney infection 07/27/12  . Ovarian cyst   . PONV (postoperative nausea and vomiting)    only from epidural hypotension  . Stomach ulcer    in college    Past obstetric history: OB History  Gravida Para Term Preterm AB Living  2 1 1  0 0 1  SAB TAB Ectopic Multiple Live Births  0 0 0 0 1    # Outcome Date GA Lbr Len/2nd Weight Sex Delivery Anes PTL Lv  2 Current           1 Term 08/21/13 [redacted]w[redacted]d 09:07 / 00:28 6 lb 14.1 oz (3.12 kg) F Vag-Spont EPI  LIV      Past Surgical History: Past Surgical History:  Procedure Laterality Date  . CHOLECYSTECTOMY  Dec 2015   . EYE SURGERY      Family History: Family History  Problem Relation Age of Onset  . Diabetes Mother   . Thyroid disease Mother   . Heart attack Father        Deceased   . Alcohol abuse Father   . Heart disease Father   . Hypertension Father   . Mental illness Father   . Breast cancer Maternal Grandmother   . Hearing loss Paternal Grandfather        MI - 70   . Hypertension Paternal Grandfather   . Heart disease Paternal Grandfather   . Stomach cancer Paternal Grandmother   . Thyroid disease Paternal Grandmother     Social History: Social History   Tobacco Use  . Smoking status: Former Smoker    Packs/day: 0.25    Types: Cigarettes    Last attempt to quit: 11/08/2012    Years since quitting: 4.3  . Smokeless tobacco: Never Used  Substance Use Topics  . Alcohol use: Yes    Alcohol/week: 0.0 oz    Comment: rare  . Drug use: No    Allergies:  Allergies  Allergen Reactions  . Dilaudid [Hydromorphone Hcl]  Shortness Of Breath    IV dilaudid causes nerve & muscle burning  . Keflex [Cephalexin]     States itching and c-diff  . Sulfa Antibiotics Hives    Meds:  No medications prior to admission.    I have reviewed patient's Past Medical Hx, Surgical Hx, Family Hx, Social Hx, medications and allergies.   ROS:  Review of Systems Other systems negative  Physical Exam   Patient Vitals for the past 24 hrs:  BP Temp Pulse Resp SpO2  03/19/17 0100 122/85 - - - -  03/19/17 0008 135/83 97.8 F (36.6 C) 93 16 99 %   Exam by RN Dilation: 1.5 Effacement (%): 60 Exam by:: Philis Pique RN   FHT:  Baseline 140 , moderate variability, accelerations present, no decelerations Contractions:  Irregular     Labs: No results found for this or any previous visit (from the past 24 hour(s)).  Imaging:  No results found.  MAU Course/MDM: I have reviewed nonstress test It was reactive, category I with irregular contractions  Assessment: 1. False labor   2.     Reactive NST, category I  Plan: Discharge home Labor precautions and fetal kick counts Follow up in Office for prenatal visits and recheck  Pt stable at time of discharge.  Hansel Feinstein CNM, MSN Certified Nurse-Midwife 03/19/2017 5:18 AM

## 2017-03-19 NOTE — Anesthesia Procedure Notes (Signed)
Epidural Patient location during procedure: OB  Staffing Anesthesiologist: Lyndle Herrlich, MD  Preanesthetic Checklist Completed: patient identified, pre-op evaluation, timeout performed, IV checked, risks and benefits discussed and monitors and equipment checked  Epidural Patient position: sitting Prep: DuraPrep Patient monitoring: blood pressure and continuous pulse ox Approach: right paramedian Location: L3-L4 Injection technique: LOR air  Needle:  Needle type: Tuohy  Needle gauge: 17 G Needle insertion depth: 9 cm Catheter type: closed end flexible Catheter size: 19 Gauge Catheter at skin depth: 17 cm Test dose: negative  Assessment Sensory level: T8  Additional Notes  Dosing of Epidural:  1st dose, through catheter ............................................Marland Kitchen  Xylocaine 40 mg  2nd dose, through catheter, after waiting 3 minutes........Marland KitchenXylocaine 60 mg    As each dose occurred, patient was free of IV sx; and patient exhibited no evidence of SA injection.  Patient is more comfortable after epidural dosed. Please see RN's note for documentation of vital signs,and FHR which are stable.  Patient reminded not to try to ambulate with numb legs, and that an RN must be present when she attempts to get up.

## 2017-03-19 NOTE — MAU Note (Signed)
I have communicated with Dr. Corinna Capra and reviewed vital signs:  Vitals:   03/19/17 0008 03/19/17 0100  BP: 135/83 122/85  Pulse: 93   Resp: 16   Temp: 97.8 F (36.6 C)   SpO2: 99%     Vaginal exam:  Dilation: 1.5 Effacement (%): 60 Exam by:: Philis Pique RN ,   Also reviewed contraction pattern and that non-stress test is reactive.  It has been documented that patient is contracting every 5-6  minutes with no cervical change over since last exam in the office not indicating active labor.  Patient denies any other complaints.  Based on this report provider has given order for discharge.  A discharge order and diagnosis entered by a provider.   Labor discharge instructions reviewed with patient.

## 2017-03-19 NOTE — MAU Note (Signed)
Pt c/o contractions that started at 1630 after membranes were swept in the office. Pt c/o light pink thin mucus. +FM.

## 2017-03-20 ENCOUNTER — Encounter (HOSPITAL_COMMUNITY): Payer: Self-pay | Admitting: *Deleted

## 2017-03-20 LAB — RPR: RPR Ser Ql: NONREACTIVE

## 2017-03-20 MED ORDER — SENNOSIDES-DOCUSATE SODIUM 8.6-50 MG PO TABS
2.0000 | ORAL_TABLET | ORAL | Status: DC
  Administered 2017-03-20: 2 via ORAL
  Filled 2017-03-20: qty 2

## 2017-03-20 MED ORDER — SIMETHICONE 80 MG PO CHEW: 80.0000 mg | CHEWABLE_TABLET | ORAL | Status: DC | PRN

## 2017-03-20 MED ORDER — DIBUCAINE 1 % RE OINT: 1.0000 "application " | TOPICAL_OINTMENT | RECTAL | Status: DC | PRN

## 2017-03-20 MED ORDER — DIPHENHYDRAMINE HCL 25 MG PO CAPS: 25.0000 mg | ORAL_CAPSULE | Freq: Four times a day (QID) | ORAL | Status: DC | PRN

## 2017-03-20 MED ORDER — ONDANSETRON HCL 4 MG/2ML IJ SOLN: 4.0000 mg | INTRAMUSCULAR | Status: DC | PRN

## 2017-03-20 MED ORDER — BENZOCAINE-MENTHOL 20-0.5 % EX AERO: 1.0000 "application " | INHALATION_SPRAY | CUTANEOUS | Status: DC | PRN

## 2017-03-20 MED ORDER — COCONUT OIL OIL
1.0000 "application " | TOPICAL_OIL | Status: DC | PRN
  Administered 2017-03-21: 1 via TOPICAL
  Filled 2017-03-20: qty 120

## 2017-03-20 MED ORDER — ZOLPIDEM TARTRATE 5 MG PO TABS: 5.0000 mg | ORAL_TABLET | Freq: Every evening | ORAL | Status: DC | PRN

## 2017-03-20 MED ORDER — ACETAMINOPHEN 325 MG PO TABS: 650.0000 mg | ORAL_TABLET | ORAL | Status: DC | PRN

## 2017-03-20 MED ORDER — PRENATAL MULTIVITAMIN CH
1.0000 | ORAL_TABLET | Freq: Every day | ORAL | Status: DC
  Administered 2017-03-20: 1 via ORAL
  Filled 2017-03-20 (×2): qty 1

## 2017-03-20 MED ORDER — TETANUS-DIPHTH-ACELL PERTUSSIS 5-2.5-18.5 LF-MCG/0.5 IM SUSP: 0.5000 mL | Freq: Once | INTRAMUSCULAR | Status: DC

## 2017-03-20 MED ORDER — IBUPROFEN 600 MG PO TABS
600.0000 mg | ORAL_TABLET | Freq: Four times a day (QID) | ORAL | Status: DC
  Administered 2017-03-20 – 2017-03-21 (×6): 600 mg via ORAL
  Filled 2017-03-20 (×6): qty 1

## 2017-03-20 MED ORDER — WITCH HAZEL-GLYCERIN EX PADS: 1.0000 "application " | MEDICATED_PAD | CUTANEOUS | Status: DC | PRN

## 2017-03-20 MED ORDER — ONDANSETRON HCL 4 MG PO TABS: 4.0000 mg | ORAL_TABLET | ORAL | Status: DC | PRN

## 2017-03-20 NOTE — Lactation Note (Signed)
This note was copied from a baby's chart. Lactation Consultation Note  Patient Name: Girl Camiah Humm [id number] Date: 03/20/2017 Reason for consult: Initial assessment   Baby 30 hours old.  Mother states she bf first child for 3-4 mos.  Mother states first child had lip tie and when she realized it she had difficulty sustaining her supply and was supplementing w/ formula along with other complication she stopped bf. Reviewed hand expression with drops expressed bilaterally. She states this baby is breastfeeding well but needs to work on depth on L breast. Suggest mother call if she would like assistance w/ latching. Mother asked questions about dad giving bottle of breastmilk.  Provided education regarding supply and demand. Mom encouraged to feed baby 8-12 times/24 hours and with feeding cues.  Mom made aware of O/P services, breastfeeding support groups, community resources, and our phone # for post-discharge questions.      Maternal Data Has patient been taught Hand Expression?: Yes Does the patient have breastfeeding experience prior to this delivery?: Yes  Feeding Feeding Type: Breast Fed Length of feed: 20 min  LATCH Score                   Interventions    Lactation Tools Discussed/Used     Consult Status Consult Status: Follow-up Date: 03/21/17 Follow-up type: In-patient    Vivianne Master [facility] 03/20/2017, 11:34 AM

## 2017-03-20 NOTE — H&P (Signed)
Deanna Norton is a 32 y.o. female presenting for labor.  Antepartum course uncomplicated. GBS negative. Comfortable with epidural.  OB History    Gravida Para Term Preterm AB Living   2 1 1  0 0 1   SAB TAB Ectopic Multiple Live Births   0 0 0 0 1     Past Medical History:  Diagnosis Date  . Anxiety   . Asthma   . C. difficile diarrhea 2014   Completed Flagyl  . Kidney infection 07/27/12  . Ovarian cyst   . PONV (postoperative nausea and vomiting)    only from epidural hypotension  . Stomach ulcer    in college   Past Surgical History:  Procedure Laterality Date  . CHOLECYSTECTOMY  Dec 2015   . EYE SURGERY     Family History: family history includes Alcohol abuse in her father; Breast cancer in her maternal grandmother; Diabetes in her mother; Hearing loss in her paternal grandfather; Heart attack in her father; Heart disease in her father and paternal grandfather; Hypertension in her father and paternal grandfather; Mental illness in her father; Stomach cancer in her paternal grandmother; Thyroid disease in her mother and paternal grandmother. Social History:  reports that she quit smoking about 4 years ago. Her smoking use included cigarettes. She smoked 0.25 packs per day. she has never used smokeless tobacco. She reports that she drinks alcohol. She reports that she does not use drugs.     Maternal Diabetes: No Genetic Screening: Normal Maternal Ultrasounds/Referrals: Normal Fetal Ultrasounds or other Referrals:  None Maternal Substance Abuse:  No Significant Maternal Medications:  None Significant Maternal Lab Results:  Lab values include: Group B Strep negative Other Comments:  None  ROS Maternal Medical History:  Reason for admission: Contractions.   Contractions: Onset was 13-24 hours ago.   Frequency: regular.   Perceived severity is moderate.    Fetal activity: Perceived fetal activity is normal.   Last perceived fetal movement was within the past hour.     Prenatal complications: no prenatal complications Prenatal Complications - Diabetes: none.    Dilation: Lip/rim Effacement (%): 90 Station: 0 Exam by:: e. poore, rnc Blood pressure 127/81, pulse 97, temperature 97.7 F (36.5 C), temperature source Oral, resp. rate 18, height 5\' 11"  (1.803 m), weight 264 lb (119.7 kg), last menstrual period 06/07/2016, SpO2 100 %. Maternal Exam:  Uterine Assessment: Contraction strength is moderate.  Contraction frequency is regular.   Abdomen: Patient reports no abdominal tenderness. Fundal height is c/w dates.   Estimated fetal weight is 8#.       Physical Exam  Constitutional: She is oriented to person, place, and time. She appears well-developed and well-nourished.  GI: Soft. There is no rebound and no guarding.  Neurological: She is alert and oriented to person, place, and time.  Skin: Skin is warm and dry.  Psychiatric: She has a normal mood and affect. Her behavior is normal.    Prenatal labs: ABO, Rh: --/--/A POS (01/10 2220) Antibody: NEG (01/10 2220) Rubella: Immune (05/29 0000) RPR: Nonreactive (05/29 0000)  HBsAg: Negative (05/29 0000)  HIV: Non-reactive (05/29 0000)  GBS: Negative, Negative (12/12 0000)   Assessment/Plan: 32yo G2P1001 at [redacted]w[redacted]d with labor -Anticipate NSVD   Deanna Norton 03/20/2017, 12:14 AM

## 2017-03-20 NOTE — Anesthesia Postprocedure Evaluation (Signed)
Anesthesia Post Note  Patient: ANYELY CUNNING  Procedure(s) Performed: AN AD HOC LABOR EPIDURAL     Patient location during evaluation: Mother Baby Anesthesia Type: Epidural Level of consciousness: awake, awake and alert, oriented and patient cooperative Pain management: pain level controlled Vital Signs Assessment: post-procedure vital signs reviewed and stable Respiratory status: spontaneous breathing, nonlabored ventilation and respiratory function stable Cardiovascular status: stable Postop Assessment: no headache, no backache, patient able to bend at knees and no apparent nausea or vomiting Anesthetic complications: no    Last Vitals:  Vitals:   03/20/17 0430 03/20/17 0807  BP: 118/69 117/73  Pulse: 67 72  Resp: 18 18  Temp: 36.7 C 36.8 C  SpO2: 97% 98%    Last Pain:  Vitals:   03/20/17 0807  TempSrc: Oral  PainSc: 0-No pain   Pain Goal: Patients Stated Pain Goal: 3 (03/19/17 2158)               Amilia Vandenbrink L

## 2017-03-20 NOTE — Progress Notes (Signed)
No complaints.  BP 117/73 (BP Location: Right Arm)   Pulse 72   Temp 98.3 F (36.8 C) (Oral)   Resp 18   Ht 5\' 11"  (1.803 m)   Wt 119.7 kg (264 lb)   LMP 06/07/2016 (Exact Date)   SpO2 98%   Breastfeeding? Unknown   BMI 36.82 kg/m  Abdomen soft and non tender  Results for orders placed or performed during the hospital encounter of 03/19/17 (from the past 24 hour(s))  CBC     Status: Abnormal   Collection Time: 03/19/17  9:35 PM  Result Value Ref Range   WBC 16.5 (H) 4.0 - 10.5 K/uL   RBC 5.09 3.87 - 5.11 MIL/uL   Hemoglobin 14.7 12.0 - 15.0 g/dL   HCT 44.0 36.0 - 46.0 %   MCV 86.4 78.0 - 100.0 fL   MCH 28.9 26.0 - 34.0 pg   MCHC 33.4 30.0 - 36.0 g/dL   RDW 13.4 11.5 - 15.5 %   Platelets 288 150 - 400 K/uL  Type and screen     Status: None   Collection Time: 03/19/17 10:20 PM  Result Value Ref Range   ABO/RH(D) A POS    Antibody Screen NEG    Sample Expiration 03/22/2017    PPD # 0 doing well Routine care Discharge home tomorrow

## 2017-03-21 LAB — CBC
HCT: 37.2 % (ref 36.0–46.0)
Hemoglobin: 12.4 g/dL (ref 12.0–15.0)
MCH: 28.7 pg (ref 26.0–34.0)
MCHC: 33.3 g/dL (ref 30.0–36.0)
MCV: 86.1 fL (ref 78.0–100.0)
PLATELETS: 233 10*3/uL (ref 150–400)
RBC: 4.32 MIL/uL (ref 3.87–5.11)
RDW: 13.7 % (ref 11.5–15.5)
WBC: 9 10*3/uL (ref 4.0–10.5)

## 2017-03-21 LAB — BIRTH TISSUE RECOVERY COLLECTION (PLACENTA DONATION)

## 2017-03-21 NOTE — Lactation Note (Signed)
This note was copied from a baby's chart. Lactation Consultation Note; Mom has just finished feeding. Reports she has been feeding on and off since 8:30 am. Now going to sleep. Mom reports when baby comes off the left breast it looks like lipstick. Baby's bottom lip is tucked under- reviewed with mom and encouraged to press on chin to untuck it. Suggested using football hold sometimes to help with healing. Has coconut oil. Encouraged EBM to nipples. Reports breasts are feeling a little fuller this morning. Asking about when dad can give bottles of EBM- reviewed with parents. Encouragement given.Reviewed our phone number, OP appointments and BFGS as resources for support after DC. No further questions at present To call prn  Patient Name: Deanna Norton [id number] Date: 03/21/2017 Reason for consult: Follow-up assessment   Maternal Data Formula Feeding for Exclusion: No Has patient been taught Hand Expression?: Yes Does the patient have breastfeeding experience prior to this delivery?: Yes  Feeding    LATCH Score                   Interventions    Lactation Tools Discussed/Used WIC Program: No   Consult Status Consult Status: Complete    Truddie Crumble 03/21/2017, 10:02 AM

## 2017-03-21 NOTE — Discharge Summary (Signed)
Obstetric Discharge Summary Reason for Admission: onset of labor Prenatal Procedures: none Intrapartum Procedures: spontaneous vaginal delivery Postpartum Procedures: none Complications-Operative and Postpartum: none Hemoglobin  Date Value Ref Range Status  03/21/2017 12.4 12.0 - 15.0 g/dL Final   HCT  Date Value Ref Range Status  03/21/2017 37.2 36.0 - 46.0 % Final    Physical Exam:  General: alert, cooperative and appears stated age 32: appropriate Uterine Fundus: firm Incision: healing well, no significant drainage, no dehiscence DVT Evaluation: No evidence of DVT seen on physical exam.  Discharge Diagnoses: Term Pregnancy-delivered  Discharge Information: Date: 03/21/2017 Activity: pelvic rest Diet: routine Medications: None Condition: improved Instructions: refer to practice specific booklet Discharge to: home   Newborn Data: Live born female  Birth Weight: 8 lb 2.9 oz (3710 g) APGAR: 9, 9  Newborn Delivery   Birth date/time:  03/20/2017 01:10:00 Delivery type:  Vaginal, Spontaneous     Home with mother.  Deanna Norton 03/21/2017, 8:09 AM

## 2017-03-23 ENCOUNTER — Inpatient Hospital Stay (HOSPITAL_COMMUNITY): Admission: RE | Admit: 2017-03-23 | Payer: 59 | Source: Ambulatory Visit | Admitting: Obstetrics and Gynecology

## 2017-10-02 DIAGNOSIS — Z13220 Encounter for screening for lipoid disorders: Secondary | ICD-10-CM | POA: Diagnosis not present

## 2017-10-02 DIAGNOSIS — Z0000 Encounter for general adult medical examination without abnormal findings: Secondary | ICD-10-CM | POA: Diagnosis not present

## 2017-11-26 DIAGNOSIS — R3989 Other symptoms and signs involving the genitourinary system: Secondary | ICD-10-CM | POA: Diagnosis not present

## 2017-11-26 DIAGNOSIS — R82998 Other abnormal findings in urine: Secondary | ICD-10-CM | POA: Diagnosis not present

## 2017-11-26 DIAGNOSIS — N760 Acute vaginitis: Secondary | ICD-10-CM | POA: Diagnosis not present

## 2017-12-16 DIAGNOSIS — Z23 Encounter for immunization: Secondary | ICD-10-CM | POA: Diagnosis not present

## 2018-02-25 DIAGNOSIS — N760 Acute vaginitis: Secondary | ICD-10-CM | POA: Diagnosis not present

## 2018-02-25 DIAGNOSIS — R399 Unspecified symptoms and signs involving the genitourinary system: Secondary | ICD-10-CM | POA: Diagnosis not present

## 2018-02-25 DIAGNOSIS — R102 Pelvic and perineal pain: Secondary | ICD-10-CM | POA: Diagnosis not present

## 2018-02-25 DIAGNOSIS — R82998 Other abnormal findings in urine: Secondary | ICD-10-CM | POA: Diagnosis not present

## 2018-04-15 DIAGNOSIS — N760 Acute vaginitis: Secondary | ICD-10-CM | POA: Diagnosis not present

## 2018-04-15 DIAGNOSIS — R300 Dysuria: Secondary | ICD-10-CM | POA: Diagnosis not present

## 2018-04-28 DIAGNOSIS — E559 Vitamin D deficiency, unspecified: Secondary | ICD-10-CM | POA: Diagnosis not present

## 2018-04-28 DIAGNOSIS — R102 Pelvic and perineal pain: Secondary | ICD-10-CM | POA: Diagnosis not present

## 2018-04-30 DIAGNOSIS — L509 Urticaria, unspecified: Secondary | ICD-10-CM | POA: Diagnosis not present

## 2018-06-24 DIAGNOSIS — Z6836 Body mass index (BMI) 36.0-36.9, adult: Secondary | ICD-10-CM | POA: Diagnosis not present

## 2018-06-24 DIAGNOSIS — Z01419 Encounter for gynecological examination (general) (routine) without abnormal findings: Secondary | ICD-10-CM | POA: Diagnosis not present

## 2018-07-29 DIAGNOSIS — R7989 Other specified abnormal findings of blood chemistry: Secondary | ICD-10-CM | POA: Diagnosis not present

## 2018-08-02 DIAGNOSIS — Q453 Other congenital malformations of pancreas and pancreatic duct: Secondary | ICD-10-CM | POA: Diagnosis not present

## 2018-08-11 ENCOUNTER — Other Ambulatory Visit: Payer: Self-pay | Admitting: Gastroenterology

## 2018-08-11 DIAGNOSIS — R109 Unspecified abdominal pain: Secondary | ICD-10-CM

## 2018-08-11 DIAGNOSIS — R110 Nausea: Secondary | ICD-10-CM

## 2018-08-19 ENCOUNTER — Ambulatory Visit
Admission: RE | Admit: 2018-08-19 | Discharge: 2018-08-19 | Disposition: A | Discharge status: Home / Self Care | Payer: 59 | Source: Ambulatory Visit | Attending: Gastroenterology | Admitting: Gastroenterology

## 2018-08-19 DIAGNOSIS — R109 Unspecified abdominal pain: Secondary | ICD-10-CM

## 2018-08-19 DIAGNOSIS — R110 Nausea: Secondary | ICD-10-CM

## 2018-08-19 MED ORDER — IOPAMIDOL (ISOVUE-300) INJECTION 61%
100.0000 mL | Freq: Once | INTRAVENOUS | Status: AC | PRN
  Administered 2018-08-19: 100 mL via INTRAVENOUS

## 2018-08-27 ENCOUNTER — Other Ambulatory Visit: Payer: 59

## 2019-08-10 ENCOUNTER — Other Ambulatory Visit: Payer: Self-pay | Admitting: Family Medicine

## 2019-08-10 DIAGNOSIS — R918 Other nonspecific abnormal finding of lung field: Secondary | ICD-10-CM

## 2019-08-10 DIAGNOSIS — Z87891 Personal history of nicotine dependence: Secondary | ICD-10-CM

## 2019-08-23 ENCOUNTER — Ambulatory Visit
Admission: RE | Admit: 2019-08-23 | Discharge: 2019-08-23 | Disposition: A | Discharge status: Home / Self Care | Payer: 59 | Source: Ambulatory Visit | Attending: Family Medicine | Admitting: Family Medicine

## 2019-08-23 ENCOUNTER — Other Ambulatory Visit: Payer: Self-pay

## 2019-08-23 DIAGNOSIS — R918 Other nonspecific abnormal finding of lung field: Secondary | ICD-10-CM

## 2019-08-23 DIAGNOSIS — Z87891 Personal history of nicotine dependence: Secondary | ICD-10-CM

## 2020-11-04 ENCOUNTER — Other Ambulatory Visit: Payer: Self-pay

## 2020-11-04 ENCOUNTER — Encounter (HOSPITAL_COMMUNITY): Payer: Self-pay | Admitting: Emergency Medicine

## 2020-11-04 ENCOUNTER — Emergency Department (HOSPITAL_COMMUNITY): Payer: 59

## 2020-11-04 ENCOUNTER — Observation Stay (HOSPITAL_COMMUNITY)
Admission: EM | Admit: 2020-11-04 | Discharge: 2020-11-06 | Disposition: A | Discharge status: Home / Self Care | Payer: 59 | Attending: Internal Medicine | Admitting: Internal Medicine

## 2020-11-04 DIAGNOSIS — Z79899 Other long term (current) drug therapy: Secondary | ICD-10-CM | POA: Insufficient documentation

## 2020-11-04 DIAGNOSIS — R109 Unspecified abdominal pain: Secondary | ICD-10-CM | POA: Diagnosis present

## 2020-11-04 DIAGNOSIS — I2699 Other pulmonary embolism without acute cor pulmonale: Secondary | ICD-10-CM | POA: Diagnosis present

## 2020-11-04 DIAGNOSIS — Z9882 Breast implant status: Secondary | ICD-10-CM

## 2020-11-04 DIAGNOSIS — Z87891 Personal history of nicotine dependence: Secondary | ICD-10-CM | POA: Insufficient documentation

## 2020-11-04 DIAGNOSIS — Z20822 Contact with and (suspected) exposure to covid-19: Secondary | ICD-10-CM | POA: Diagnosis not present

## 2020-11-04 DIAGNOSIS — J45909 Unspecified asthma, uncomplicated: Secondary | ICD-10-CM | POA: Insufficient documentation

## 2020-11-04 DIAGNOSIS — I2693 Single subsegmental pulmonary embolism without acute cor pulmonale: Secondary | ICD-10-CM | POA: Diagnosis not present

## 2020-11-04 DIAGNOSIS — D649 Anemia, unspecified: Secondary | ICD-10-CM

## 2020-11-04 LAB — URINALYSIS, ROUTINE W REFLEX MICROSCOPIC
Bilirubin Urine: NEGATIVE
Glucose, UA: NEGATIVE mg/dL
Ketones, ur: NEGATIVE mg/dL
Nitrite: NEGATIVE
Protein, ur: 30 mg/dL — AB
Specific Gravity, Urine: 1.043 — ABNORMAL HIGH (ref 1.005–1.030)
pH: 6 (ref 5.0–8.0)

## 2020-11-04 LAB — CBC WITH DIFFERENTIAL/PLATELET
Abs Immature Granulocytes: 0.06 10*3/uL (ref 0.00–0.07)
Basophils Absolute: 0 10*3/uL (ref 0.0–0.1)
Basophils Relative: 0 %
Eosinophils Absolute: 0.2 10*3/uL (ref 0.0–0.5)
Eosinophils Relative: 1 %
HCT: 33.6 % — ABNORMAL LOW (ref 36.0–46.0)
Hemoglobin: 10.9 g/dL — ABNORMAL LOW (ref 12.0–15.0)
Immature Granulocytes: 1 %
Lymphocytes Relative: 11 %
Lymphs Abs: 1.4 10*3/uL (ref 0.7–4.0)
MCH: 29.5 pg (ref 26.0–34.0)
MCHC: 32.4 g/dL (ref 30.0–36.0)
MCV: 91.1 fL (ref 80.0–100.0)
Monocytes Absolute: 0.8 10*3/uL (ref 0.1–1.0)
Monocytes Relative: 7 %
Neutro Abs: 9.7 10*3/uL — ABNORMAL HIGH (ref 1.7–7.7)
Neutrophils Relative %: 80 %
Platelets: 342 10*3/uL (ref 150–400)
RBC: 3.69 MIL/uL — ABNORMAL LOW (ref 3.87–5.11)
RDW: 12.5 % (ref 11.5–15.5)
WBC: 12.2 10*3/uL — ABNORMAL HIGH (ref 4.0–10.5)
nRBC: 0 % (ref 0.0–0.2)

## 2020-11-04 LAB — COMPREHENSIVE METABOLIC PANEL
ALT: 40 U/L (ref 0–44)
AST: 21 U/L (ref 15–41)
Albumin: 3.5 g/dL (ref 3.5–5.0)
Alkaline Phosphatase: 45 U/L (ref 38–126)
Anion gap: 7 (ref 5–15)
BUN: 13 mg/dL (ref 6–20)
CO2: 25 mmol/L (ref 22–32)
Calcium: 8.8 mg/dL — ABNORMAL LOW (ref 8.9–10.3)
Chloride: 107 mmol/L (ref 98–111)
Creatinine, Ser: 0.92 mg/dL (ref 0.44–1.00)
GFR, Estimated: >60 mL/min (ref >60)
Glucose, Bld: 134 mg/dL — ABNORMAL HIGH (ref 70–99)
Potassium: 4 mmol/L (ref 3.5–5.1)
Sodium: 139 mmol/L (ref 135–145)
Total Bilirubin: 1 mg/dL (ref 0.3–1.2)
Total Protein: 7.1 g/dL (ref 6.5–8.1)

## 2020-11-04 LAB — LIPASE, BLOOD: Lipase: 30 U/L (ref 11–51)

## 2020-11-04 LAB — I-STAT BETA HCG BLOOD, ED (MC, WL, AP ONLY): I-stat hCG, quantitative: <5 m[IU]/mL (ref <5)

## 2020-11-04 LAB — LACTIC ACID, PLASMA: Lactic Acid, Venous: 1.1 mmol/L (ref 0.5–1.9)

## 2020-11-04 MED ORDER — HEPARIN (PORCINE) 25000 UT/250ML-% IV SOLN
1400.0000 [IU]/h | INTRAVENOUS | Status: DC
  Administered 2020-11-04: 1400 [IU]/h via INTRAVENOUS
  Filled 2020-11-04: qty 250

## 2020-11-04 MED ORDER — MORPHINE SULFATE (PF) 4 MG/ML IV SOLN
4.0000 mg | Freq: Once | INTRAVENOUS | Status: AC
Start: 2020-11-04 — End: 2020-11-04
  Administered 2020-11-04: 4 mg via INTRAVENOUS
  Filled 2020-11-04: qty 1

## 2020-11-04 MED ORDER — FENTANYL CITRATE PF 50 MCG/ML IJ SOSY
50.0000 ug | PREFILLED_SYRINGE | Freq: Once | INTRAMUSCULAR | Status: DC
  Filled 2020-11-04: qty 1

## 2020-11-04 MED ORDER — MORPHINE SULFATE (PF) 2 MG/ML IV SOLN
2.0000 mg | Freq: Once | INTRAVENOUS | Status: AC
  Administered 2020-11-04: 2 mg via INTRAVENOUS
  Filled 2020-11-04: qty 1

## 2020-11-04 MED ORDER — SODIUM CHLORIDE 0.9 % IV BOLUS
1000.0000 mL | Freq: Once | INTRAVENOUS | Status: AC
  Administered 2020-11-04: 1000 mL via INTRAVENOUS

## 2020-11-04 MED ORDER — ONDANSETRON HCL 4 MG/2ML IJ SOLN
4.0000 mg | Freq: Once | INTRAMUSCULAR | Status: AC
  Administered 2020-11-04: 4 mg via INTRAVENOUS
  Filled 2020-11-04: qty 2

## 2020-11-04 MED ORDER — HEPARIN BOLUS VIA INFUSION
4000.0000 [IU] | Freq: Once | INTRAVENOUS | Status: AC
  Administered 2020-11-04: 4000 [IU] via INTRAVENOUS
  Filled 2020-11-04: qty 4000

## 2020-11-04 MED ORDER — IOHEXOL 350 MG/ML SOLN
100.0000 mL | Freq: Once | INTRAVENOUS | Status: AC | PRN
  Administered 2020-11-04: 100 mL via INTRAVENOUS

## 2020-11-04 NOTE — ED Notes (Signed)
Unable to provide urine specimen at this time.  

## 2020-11-04 NOTE — ED Provider Notes (Addendum)
[facility] DEPT Provider Note   CSN: [id number] Arrival date & time: 11/04/20  1715     History No chief complaint on file.   Deanna Norton is a 35 y.o. female who presents to the ED today with complaint of gradual onset, constant, severe, sharp, right sided flank pain that began yesterday. Pt recently had a tummy tuck, liposuction to her back, breast lift, and breast augmentation on Monday 08/22. She reports she had been doing well at home until last night. She also reports a low grade temp of 99.6. She was prescribed Oxycodone after the surgery however states it made her feel loopy so she stopped taking it 3 days ago and has been taking extra strength Tylenol since then. She states she feels short of breath but believes it is secondary to the pain. Pt called her surgeon who advised she come to the ED for further evaluation. She denies chills, nausea, vomiting, chest pain.   The history is provided by the patient and medical records.      Past Medical History:  Diagnosis Date   Anxiety    Asthma    C. 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   Panic attacks 12/23/2011   Overweight(278.02) 08/11/2011   Fatigue 08/11/2011   Gluten intolerance 08/11/2011   Lactose intolerance 08/11/2011   Gallbladder disease 08/11/2011   Tobacco abuse 08/11/2011    Past Surgical History:  Procedure Laterality Date   BREAST ENHANCEMENT SURGERY     CHOLECYSTECTOMY  02/2014   EYE SURGERY     LIPOSUCTION       OB History     Gravida  2   Para  2   Term  2   Preterm  0   AB  0   Living  2      SAB  0   IAB  0   Ectopic   0   Multiple  0   Live Births  2           Family History  Problem Relation Age of Onset   Diabetes Mother    Thyroid disease Mother    Heart attack Father        Deceased    Alcohol abuse Father    Heart disease Father    Hypertension Father    Mental illness Father    Breast cancer Maternal Grandmother    Hearing loss Paternal Grandfather        MI - 59    Hypertension Paternal Grandfather    Heart disease Paternal Grandfather    Stomach cancer Paternal Grandmother    Thyroid disease Paternal Grandmother     Social History   Tobacco Use   Smoking status: Former    Packs/day: 0.25    Types: Cigarettes    Quit date: 11/08/2012    Years since quitting: 7.9   Smokeless tobacco: Never  Substance Use Topics   Alcohol use: Yes    Alcohol/week: 0.0 standard drinks    Comment: rare   Drug use: No    Home Medications Prior to Admission medications  Medication Sig Start Date End Date Taking? Authorizing Provider  gabapentin (NEURONTIN) 300 MG capsule Take 300 mg by mouth daily as needed (nerve pain). 10/05/20  Yes [provider]  levofloxacin (LEVAQUIN) 500 MG tablet Take 500 mg by mouth daily. 11/04/20  Yes [provider]  ondansetron (ZOFRAN) 4 MG tablet Take 4 mg by mouth every 8 (eight) hours as needed for nausea or vomiting. 10/05/20  Yes [provider]  oxyCODONE (OXY IR/ROXICODONE) 5 MG immediate release tablet Take 5-10 mg by mouth every 6 (six) hours as needed for severe pain. 10/05/20  Yes [provider]    Allergies    Dilaudid [hydromorphone hcl], Keflex [cephalexin], Nitrofurantoin, and Sulfa antibiotics  Review of Systems   Review of Systems  Constitutional:  Negative for chills and fever.  Respiratory:  Positive for shortness of breath.   Cardiovascular:  Negative for chest pain.  Gastrointestinal:  Positive for abdominal pain. Negative for nausea and vomiting.  Genitourinary:  Positive for flank pain.  All  other systems reviewed and are negative.  Physical Exam Updated Vital Signs BP 122/79 (BP Location: Right Arm)   Pulse (!) 101   Temp 98.1 F (36.7 C) (Oral)   Resp 16   Ht '5\' 11"'$  (1.803 m)   Wt 86.2 kg   LMP 11/03/2020   SpO2 100%   BMI 26.50 kg/m   Physical Exam Vitals and nursing note reviewed.  Constitutional:      Appearance: She is not ill-appearing.     Comments: Uncomfortable appearing female. Sitting upright in bed.  HENT:     Head: Normocephalic and atraumatic.     Mouth/Throat:     Mouth: Mucous membranes are dry.  Eyes:     Conjunctiva/sclera: Conjunctivae normal.  Cardiovascular:     Rate and Rhythm: Regular rhythm. Tachycardia present.  Pulmonary:     Effort: Pulmonary effort is normal.     Breath sounds: Normal breath sounds. No wheezing, rhonchi or rales.     Comments: Able to speak in full sentences without difficulty. Satting 100% on RA.  Abdominal:     Palpations: Abdomen is soft.     Tenderness: There is no abdominal tenderness. There is right CVA tenderness. There is no guarding or rebound.     Comments: Surgical drain in place to abdomen. Ecchymosis noted to bilateral flanks however TTP to the right flank  Musculoskeletal:     Cervical back: Neck supple.  Skin:    General: Skin is warm and dry.  Neurological:     Mental Status: She is alert.    ED Results / Procedures / Treatments   Labs (all labs ordered are listed, but only abnormal results are displayed) Labs Reviewed  COMPREHENSIVE METABOLIC PANEL - Abnormal; Notable for the following components:      Result Value   Glucose, Bld 134 (*)    Calcium 8.8 (*)    All other components within normal limits  CBC WITH DIFFERENTIAL/PLATELET - Abnormal; Notable for the following components:   WBC 12.2 (*)    RBC 3.69 (*)    Hemoglobin 10.9 (*)    HCT 33.6 (*)    Neutro Abs 9.7 (*)    All other components within normal limits  SARS CORONAVIRUS 2 (TAT 6-24 HRS)  LIPASE, BLOOD  LACTIC ACID,  PLASMA  URINALYSIS, ROUTINE W REFLEX MICROSCOPIC  CBC  HEPARIN LEVEL (UNFRACTIONATED)  I-STAT BETA HCG BLOOD, ED ([facility], [facility], [facility] ONLY)    EKG None  Radiology  CT Angio Chest PE W/Cm &/Or Wo Cm  Result Date: 11/04/2020 CLINICAL DATA:  Shortness of breath. Right flank pain. Suspected pulmonary embolus. Recent plastic surgery. Tummy tuck. Liposuction. Breast lift. Breast augmentation. EXAM: CT ANGIOGRAPHY CHEST CT ABDOMEN AND PELVIS WITH CONTRAST TECHNIQUE: Multidetector CT imaging of the chest was performed using the standard protocol during bolus administration of intravenous contrast. Multiplanar CT image reconstructions and MIPs were obtained to evaluate the vascular anatomy. Multidetector CT imaging of the abdomen and pelvis was performed using the standard protocol during bolus administration of intravenous contrast. CONTRAST:  166m OMNIPAQUE IOHEXOL 350 MG/ML SOLN COMPARISON:  CT chest 08/23/2019, CT abdomen pelvis 08/19/2018 FINDINGS: CTA CHEST FINDINGS Cardiovascular: Satisfactory opacification of the pulmonary arteries to the segmental level. Right lower lobe subsegmental pulmonary artery defect. The main pulmonary artery is normal in caliber. Normal heart size. Normal ventricular ratio. No pericardial effusion. Mediastinum/Nodes: No enlarged mediastinal, hilar, or axillary lymph nodes. Thyroid gland, trachea, and esophagus demonstrate no significant findings. Lungs/Pleura: Right lower lobe atelectasis. No focal consolidation. No pulmonary nodule. No pulmonary mass. No pleural effusion. No pneumothorax. No definite pulmonary infarction identified. Musculoskeletal: Bilateral breast implants. Mild subcutaneus soft tissue edema and emphysema likely postsurgical in etiology. Associated skin thickening bilaterally. No suspicious lytic or blastic osseous lesions. No acute displaced fracture. Review of the MIP images confirms the above findings. CT ABDOMEN and PELVIS FINDINGS Hepatobiliary: No focal liver  abnormality. Status post cholecystectomy. No biliary dilatation. Pancreas: No focal lesion. Normal pancreatic contour. No surrounding inflammatory changes. No main pancreatic ductal dilatation. Spleen: Normal in size without focal abnormality. Adrenals/Urinary Tract: No adrenal nodule bilaterally. Bilateral kidneys enhance symmetrically. No hydronephrosis. No hydroureter. The urinary bladder is unremarkable. Stomach/Bowel: Stomach is within normal limits. No evidence of bowel wall thickening or dilatation. Appendix appears normal. Vascular/Lymphatic: No abdominal aorta or iliac aneurysm. No abdominal, pelvic, or inguinal lymphadenopathy. Reproductive: Uterus and bilateral adnexa are unremarkable. Other: No intraperitoneal free fluid. No intraperitoneal free gas. No organized fluid collection. Musculoskeletal: Diffuse subcutaneus soft tissue edema and emphysema likely postsurgical in etiology. No organized fluid collection. A JP drain is noted within the subcutaneus soft tissues of the abdomen the level of the plication/surgical changes. No suspicious lytic or blastic osseous lesions. No acute displaced fracture. Review of the MIP images confirms the above findings. IMPRESSION: 1. Right lower lobe subsegmental pulmonary embolus. No definite pulmonary infarction. No right heart strain. 2. Mild subcutaneus soft tissue edema and emphysema of the bilateral breasts with associated overlying dermal thickening likely postsurgical in etiology status post breast surgery. No organized fluid collection. 3. Mild subcutaneus soft tissue edema and emphysema along the anterior abdomen likely postsurgical. No organized fluid collection. 4. No acute intra-abdominal or intrapelvic abnormality. These results were called by telephone at the time of interpretation on 11/04/2020 at 9:48 pm to provider Dillion Stowers , who verbally acknowledged these results. Electronically Signed   By: MIven FinnM.D.   On: 11/04/2020 21:54   CT  Abdomen Pelvis W Contrast  Result Date: 11/04/2020 CLINICAL DATA:  Shortness of breath. Right flank pain. Suspected pulmonary embolus. Recent plastic surgery. Tummy tuck. Liposuction. Breast lift. Breast augmentation. EXAM: CT ANGIOGRAPHY CHEST CT ABDOMEN AND PELVIS WITH CONTRAST TECHNIQUE: Multidetector CT imaging of the chest was performed using the standard protocol during bolus administration of intravenous contrast. Multiplanar CT image reconstructions and MIPs were obtained to evaluate the vascular anatomy. Multidetector CT imaging of the abdomen and pelvis was performed using the standard protocol during bolus administration  of intravenous contrast. CONTRAST:  145m OMNIPAQUE IOHEXOL 350 MG/ML SOLN COMPARISON:  CT chest 08/23/2019, CT abdomen pelvis 08/19/2018 FINDINGS: CTA CHEST FINDINGS Cardiovascular: Satisfactory opacification of the pulmonary arteries to the segmental level. Right lower lobe subsegmental pulmonary artery defect. The main pulmonary artery is normal in caliber. Normal heart size. Normal ventricular ratio. No pericardial effusion. Mediastinum/Nodes: No enlarged mediastinal, hilar, or axillary lymph nodes. Thyroid gland, trachea, and esophagus demonstrate no significant findings. Lungs/Pleura: Right lower lobe atelectasis. No focal consolidation. No pulmonary nodule. No pulmonary mass. No pleural effusion. No pneumothorax. No definite pulmonary infarction identified. Musculoskeletal: Bilateral breast implants. Mild subcutaneus soft tissue edema and emphysema likely postsurgical in etiology. Associated skin thickening bilaterally. No suspicious lytic or blastic osseous lesions. No acute displaced fracture. Review of the MIP images confirms the above findings. CT ABDOMEN and PELVIS FINDINGS Hepatobiliary: No focal liver abnormality. Status post cholecystectomy. No biliary dilatation. Pancreas: No focal lesion. Normal pancreatic contour. No surrounding inflammatory changes. No main pancreatic  ductal dilatation. Spleen: Normal in size without focal abnormality. Adrenals/Urinary Tract: No adrenal nodule bilaterally. Bilateral kidneys enhance symmetrically. No hydronephrosis. No hydroureter. The urinary bladder is unremarkable. Stomach/Bowel: Stomach is within normal limits. No evidence of bowel wall thickening or dilatation. Appendix appears normal. Vascular/Lymphatic: No abdominal aorta or iliac aneurysm. No abdominal, pelvic, or inguinal lymphadenopathy. Reproductive: Uterus and bilateral adnexa are unremarkable. Other: No intraperitoneal free fluid. No intraperitoneal free gas. No organized fluid collection. Musculoskeletal: Diffuse subcutaneus soft tissue edema and emphysema likely postsurgical in etiology. No organized fluid collection. A JP drain is noted within the subcutaneus soft tissues of the abdomen the level of the plication/surgical changes. No suspicious lytic or blastic osseous lesions. No acute displaced fracture. Review of the MIP images confirms the above findings. IMPRESSION: 1. Right lower lobe subsegmental pulmonary embolus. No definite pulmonary infarction. No right heart strain. 2. Mild subcutaneus soft tissue edema and emphysema of the bilateral breasts with associated overlying dermal thickening likely postsurgical in etiology status post breast surgery. No organized fluid collection. 3. Mild subcutaneus soft tissue edema and emphysema along the anterior abdomen likely postsurgical. No organized fluid collection. 4. No acute intra-abdominal or intrapelvic abnormality. These results were called by telephone at the time of interpretation on 11/04/2020 at 9:48 pm to provider Emelda Kohlbeck , who verbally acknowledged these results. Electronically Signed   By: MIven FinnM.D.   On: 11/04/2020 21:54    Procedures .Critical Care  Date/Time: 11/04/2020 11:13 PM Performed by: VEustaquio Maize PA-C Authorized by: VEustaquio Maize PA-C   Critical care provider statement:     Critical care time (minutes):  45   Critical care was necessary to treat or prevent imminent or life-threatening deterioration of the following conditions: PE.   Critical care was time spent personally by me on the following activities:  Discussions with consultants, evaluation of patient's response to treatment, examination of patient, ordering and performing treatments and interventions, ordering and review of laboratory studies, ordering and review of radiographic studies, pulse oximetry, re-evaluation of patient's condition, obtaining history from patient or surrogate and review of old charts   Medications Ordered in ED Medications  heparin ADULT infusion 100 units/mL (25000 units/2582m (1,400 Units/hr Intravenous New Bag/Given 11/04/20 2234)  sodium chloride 0.9 % bolus 1,000 mL (0 mLs Intravenous Stopped 11/04/20 1940)  morphine 4 MG/ML injection 4 mg (4 mg Intravenous Given 11/04/20 1902)  ondansetron (ZOFRAN) injection 4 mg (4 mg Intravenous Given 11/04/20 1902)  iohexol (OMNIPAQUE) 350 MG/ML injection 100  mL (100 mLs Intravenous Contrast Given 11/04/20 2107)  morphine 2 MG/ML injection 2 mg (2 mg Intravenous Given 11/04/20 2055)  heparin bolus via infusion 4,000 Units (4,000 Units Intravenous Bolus from Bag 11/04/20 2234)    ED Course  I have reviewed the triage vital signs and the nursing notes.  Pertinent labs & imaging results that were available during my care of the patient were reviewed by me and considered in my medical decision making (see chart for details).  Clinical Course as of 11/04/20 2314  Nancy Fetter Nov 04, 2020  2149 Lower subsegmental PE.  [MV]    Clinical Course User Index [MV] Eustaquio Maize, [place]   MDM Rules/Calculators/A&P                           35 year old female who presents to the ED today with complaint of right-sided pain that began earlier yesterday with associated shortness of breath.  Recent tummy tuck, liposuction, breast lift and augmentation 1 week ago.   On arrival to the ED today patient is afebrile at 98.1.  She does report she had a low-grade fever of 99.6 last night.  She is tachycardic up to 110s.  She is nontachypneic.  Satting 100% on room air.  She is noted to have ecchymosis to her bilateral flanks however only tenderness palpation to the right flank.  No abdominal tenderness palpation.  She has clear lungs however has difficulty obtaining a full breath secondary to pain.  Given her recent surgery with low-grade fever concern for possible postop infection.  Also concern given her shortness of breath and tachycardia with recent surgery.  We will plan for CTA chest as well as CT abdomen and pelvis for further evaluation.  Fluids, pain medication, antiemetics provided.  CBC with mild leukocytosis of 12,200.  Hemoglobin 10.9. Most recent hgb 3 years ago at 12.4; would suspect some decreased hgb given recent surgery and blood loss  CMP without electrolyte abnormalities.  LFTs unremarkable. Lipase 30 Lactic acid within normal limits at 1.1.   Received call from radiologist - pt with right subsegmental PE; does appear that her abdomen does not have any significant findings. Pt will need admission for PE. Will start on heparin. Will admit.   Discussed case with Dr. Flossie Buffy [facility] who agrees to accept patient for admission.   This note was prepared using Dragon voice recognition software and may include unintentional dictation errors due to the inherent limitations of voice recognition software.  Final Clinical Impression(s) / ED Diagnoses Final diagnoses:  Single subsegmental pulmonary embolism without acute cor pulmonale [facility])    Rx / DC Orders ED Discharge Orders     None         Eustaquio Maize, PA-C 11/04/20 2314    Truddie Hidden, MD 11/05/20 1031

## 2020-11-04 NOTE — Progress Notes (Signed)
ANTICOAGULATION CONSULT NOTE - Initial Consult  Pharmacy Consult for heparin Indication: pulmonary embolus  Allergies  Allergen Reactions   Dilaudid [Hydromorphone Hcl] Shortness Of Breath    IV dilaudid causes nerve & muscle burning   Keflex [Cephalexin] Itching    States itching and c-diff   Nitrofurantoin Hives   Sulfa Antibiotics Hives    Patient Measurements: Height: '5\' 11"'$  (180.3 cm) Weight: 86.2 kg (190 lb) IBW/kg (Calculated) : 70.8 Heparin Dosing Weight: 86.2kg  Vital Signs: Temp: 98.1 F (36.7 C) (08/28 1730) Temp Source: Oral (08/28 1730) BP: 122/79 (08/28 2203) Pulse Rate: 101 (08/28 2203)  Labs: Recent Labs    11/04/20 1829  HGB 10.9*  HCT 33.6*  PLT 342  CREATININE 0.92    Estimated Creatinine Clearance: 105.7 mL/min (by C-G formula based on SCr of 0.92 mg/dL).   Medical History: Past Medical History:  Diagnosis Date   Anxiety    Asthma    C. difficile diarrhea 2014   Completed Flagyl   Kidney infection 07/27/12   Ovarian cyst    PONV (postoperative nausea and vomiting)    only from epidural hypotension   Stomach ulcer    in college      Assessment: 35 year old female who presents to the ED today with complaint of right-sided pain that began earlier yesterday with associated shortness of breath.  Recent tummy tuck, liposuction, breast lift and augmentation 1 week ago.  Pharmacy consulted to dose heparin for PE.  No prior AC noted.  Hgb 10.9, Plts WNL, Scr 0.92  Goal of Therapy:  Heparin level 0.3-0.7 units/ml Monitor platelets by anticoagulation protocol: Yes   Plan:  Heparin bolus 4000 units/hr Start heparin drip at 1400 units/hr Heparin level in 6 hours Daily CBC  Dolly Rias RPh 11/04/2020, 10:18 PM

## 2020-11-04 NOTE — ED Triage Notes (Signed)
States she had the 'mommy makeover' surgery Monday, reports SOB since sx and a temp of 99.6 last night. R flank/ mid-back pain that is relieved by putting pressure, reports 'spasaming' pain. Tummy tuck, lipo, lift and breast implants.

## 2020-11-04 NOTE — ED Provider Notes (Signed)
Emergency Medicine Provider Triage Evaluation Note  LORELI BENZINGER , a 35 y.o. female  was evaluated in triage.  Pt complains of recent surgery on Monday with shortness of breath and left sided back pain worsening since last night.  She reports "low grade" fevers at home.  She had the surgery in [place].    Review of Systems  Positive: Back pain, shortness of breath Negative: Temp over 100.4  Physical Exam  BP (!) 135/94 (BP Location: Left Arm)   Pulse (!) 108   Temp 98.1 F (36.7 C) (Oral)   Resp 19   Ht '5\' 11"'$  (1.803 m)   Wt 86.2 kg   LMP 11/03/2020   SpO2 100%   BMI 26.50 kg/m  Gen:   Awake, appears ill, uncomfortable Resp:  Normal effort  MSK:   Moves extremities without difficulty  Other:  Contusion and pain over left flank.   Medical Decision Making  Medically screening exam initiated at 5:49 PM.  Appropriate orders placed.  Karleen Dolphin was informed that the remainder of the evaluation will be completed by another provider, this initial triage assessment does not replace that evaluation, and the importance of remaining in the ED until their evaluation is complete.  Patient taken back to room.    Lorin Glass, PA-C 11/04/20 1751    Jeanell Sparrow, DO 11/05/20 0110

## 2020-11-05 ENCOUNTER — Encounter (HOSPITAL_COMMUNITY): Payer: Self-pay | Admitting: Family Medicine

## 2020-11-05 ENCOUNTER — Observation Stay (HOSPITAL_BASED_OUTPATIENT_CLINIC_OR_DEPARTMENT_OTHER): Payer: 59

## 2020-11-05 DIAGNOSIS — I2699 Other pulmonary embolism without acute cor pulmonale: Secondary | ICD-10-CM

## 2020-11-05 DIAGNOSIS — Z9882 Breast implant status: Secondary | ICD-10-CM

## 2020-11-05 DIAGNOSIS — D649 Anemia, unspecified: Secondary | ICD-10-CM | POA: Diagnosis not present

## 2020-11-05 DIAGNOSIS — I2693 Single subsegmental pulmonary embolism without acute cor pulmonale: Secondary | ICD-10-CM | POA: Diagnosis not present

## 2020-11-05 LAB — CBC
HCT: 32 % — ABNORMAL LOW (ref 36.0–46.0)
Hemoglobin: 10.3 g/dL — ABNORMAL LOW (ref 12.0–15.0)
MCH: 29.7 pg (ref 26.0–34.0)
MCHC: 32.2 g/dL (ref 30.0–36.0)
MCV: 92.2 fL (ref 80.0–100.0)
Platelets: 278 10*3/uL (ref 150–400)
RBC: 3.47 MIL/uL — ABNORMAL LOW (ref 3.87–5.11)
RDW: 12.5 % (ref 11.5–15.5)
WBC: 8.2 10*3/uL (ref 4.0–10.5)
nRBC: 0 % (ref 0.0–0.2)

## 2020-11-05 LAB — BASIC METABOLIC PANEL
Anion gap: 7 (ref 5–15)
BUN: 9 mg/dL (ref 6–20)
CO2: 24 mmol/L (ref 22–32)
Calcium: 8.5 mg/dL — ABNORMAL LOW (ref 8.9–10.3)
Chloride: 104 mmol/L (ref 98–111)
Creatinine, Ser: 0.82 mg/dL (ref 0.44–1.00)
GFR, Estimated: >60 mL/min (ref >60)
Glucose, Bld: 113 mg/dL — ABNORMAL HIGH (ref 70–99)
Potassium: 4.1 mmol/L (ref 3.5–5.1)
Sodium: 135 mmol/L (ref 135–145)

## 2020-11-05 LAB — SARS CORONAVIRUS 2 (TAT 6-24 HRS): SARS Coronavirus 2: NEGATIVE

## 2020-11-05 LAB — IRON AND TIBC
Iron: 10 ug/dL — ABNORMAL LOW (ref 28–170)
Saturation Ratios: 4 % — ABNORMAL LOW (ref 10.4–31.8)
TIBC: 280 ug/dL (ref 250–450)
UIBC: 270 ug/dL

## 2020-11-05 LAB — VITAMIN B12: Vitamin B-12: 282 pg/mL (ref 180–914)

## 2020-11-05 LAB — HIV ANTIBODY (ROUTINE TESTING W REFLEX): HIV Screen 4th Generation wRfx: NONREACTIVE

## 2020-11-05 LAB — HEPARIN LEVEL (UNFRACTIONATED): Heparin Unfractionated: <0.1 IU/mL — ABNORMAL LOW (ref 0.30–0.70)

## 2020-11-05 LAB — FOLATE: Folate: 6.8 ng/mL (ref >5.9)

## 2020-11-05 MED ORDER — DOCUSATE SODIUM 100 MG PO CAPS
200.0000 mg | ORAL_CAPSULE | Freq: Two times a day (BID) | ORAL | Status: DC
  Administered 2020-11-05 – 2020-11-06 (×2): 200 mg via ORAL
  Filled 2020-11-05 (×3): qty 2

## 2020-11-05 MED ORDER — HEPARIN (PORCINE) 25000 UT/250ML-% IV SOLN
1650.0000 [IU]/h | INTRAVENOUS | Status: DC
  Administered 2020-11-05: 1650 [IU]/h via INTRAVENOUS

## 2020-11-05 MED ORDER — MORPHINE SULFATE (PF) 4 MG/ML IV SOLN
4.0000 mg | INTRAVENOUS | Status: DC | PRN
Start: 2020-11-05 — End: 2020-11-06

## 2020-11-05 MED ORDER — HEPARIN BOLUS VIA INFUSION
2500.0000 [IU] | Freq: Once | INTRAVENOUS | Status: AC
  Administered 2020-11-05: 2500 [IU] via INTRAVENOUS
  Filled 2020-11-05: qty 2500

## 2020-11-05 MED ORDER — LEVOFLOXACIN 500 MG PO TABS
500.0000 mg | ORAL_TABLET | Freq: Every day | ORAL | Status: DC
  Administered 2020-11-05 – 2020-11-06 (×2): 500 mg via ORAL
  Filled 2020-11-05 (×2): qty 1

## 2020-11-05 MED ORDER — MORPHINE SULFATE (PF) 2 MG/ML IV SOLN
2.0000 mg | Freq: Once | INTRAVENOUS | Status: AC
  Administered 2020-11-05: 2 mg via INTRAVENOUS
  Filled 2020-11-05: qty 1

## 2020-11-05 MED ORDER — MORPHINE SULFATE (PF) 2 MG/ML IV SOLN
2.0000 mg | INTRAVENOUS | Status: DC | PRN
  Administered 2020-11-05 (×4): 2 mg via INTRAVENOUS
  Filled 2020-11-05 (×4): qty 1

## 2020-11-05 MED ORDER — OXYCODONE HCL 5 MG PO TABS
5.0000 mg | ORAL_TABLET | Freq: Four times a day (QID) | ORAL | Status: DC | PRN
Start: 2020-11-05 — End: 2020-11-06
  Administered 2020-11-05 – 2020-11-06 (×4): 5 mg via ORAL
  Filled 2020-11-05 (×4): qty 1

## 2020-11-05 MED ORDER — ACETAMINOPHEN 325 MG PO TABS
650.0000 mg | ORAL_TABLET | Freq: Four times a day (QID) | ORAL | Status: DC | PRN
  Administered 2020-11-05 – 2020-11-06 (×2): 650 mg via ORAL
  Filled 2020-11-05 (×2): qty 2

## 2020-11-05 MED ORDER — APIXABAN 5 MG PO TABS: 5.0000 mg | ORAL_TABLET | Freq: Two times a day (BID) | ORAL | Status: DC

## 2020-11-05 MED ORDER — ONDANSETRON HCL 4 MG PO TABS: 4.0000 mg | ORAL_TABLET | Freq: Three times a day (TID) | ORAL | Status: DC | PRN

## 2020-11-05 MED ORDER — APIXABAN 5 MG PO TABS
10.0000 mg | ORAL_TABLET | Freq: Two times a day (BID) | ORAL | Status: DC
  Administered 2020-11-05 – 2020-11-06 (×3): 10 mg via ORAL
  Filled 2020-11-05 (×3): qty 2

## 2020-11-05 MED ORDER — GABAPENTIN 300 MG PO CAPS
300.0000 mg | ORAL_CAPSULE | Freq: Two times a day (BID) | ORAL | Status: DC
  Administered 2020-11-05 – 2020-11-06 (×3): 300 mg via ORAL
  Filled 2020-11-05 (×4): qty 1

## 2020-11-05 NOTE — Consult Note (Signed)
[facility] Nurse Consult Note: Patient receiving care in [facility] ED 21. Reason for Consult: postsurgical wounds, abdominal drain Wound type: closed surgical incisions to lower abdomen that extends from one iliac crest to another, and bilateral breast enhancement incisions.  All are closed, have surgical tape in place that is to remain in place for 6 weeks after surgical date of 10/27/20.  And, there is a JP drain in the right groin area with a film dressing over the insertion site. Pressure Injury POA: Yes/No/NA Measurement: Wound bed: no observable erythema or induration. Drainage (amount, consistency, odor) some scant drainage on the tape portions Periwound: intact Dressing procedure/placement/frequency: Dr. Terance Hart at [facility] performed the surgery.  His direct dial number should anyone have any questions about the patient or care is [phone].  There were no printed wound care instructions on the patient's surgical site discharge instructions.  I entered orders for the taped incisions, telfa pads to the breasts, and JP drain based on what she verbally told me were the instructions she had received.  Patient denies any wounds requiring evaluation to her back.  [facility] nurse will not follow at this time.  Please re-consult the [facility] team if needed.  Val Riles, RN, MSN, CWOCN, CNS-BC, pager [phone]

## 2020-11-05 NOTE — ED Notes (Signed)
Patient assisted to bedside commode.

## 2020-11-05 NOTE — ED Notes (Signed)
Admitting provider at bedside.

## 2020-11-05 NOTE — Progress Notes (Signed)
Pt spiked a fever, this RN was notified by tech.  This RN notified J. Olena Heckle, APP.  Per provider, monitor temperature and treat with tylenol and ice packs as needed.  No further orders given at this time.  Will continue to monitor.   11/05/20 2023  Assess: MEWS Score  Temp (!) 101.7 F (38.7 C) (not. nurse)  BP 125/78  Pulse Rate 99  Resp 20  SpO2 98 %  O2 Device Room Air  Assess: MEWS Score  MEWS Temp 2  MEWS Systolic 0  MEWS Pulse 0  MEWS RR 0  MEWS LOC 0  MEWS Score 2  MEWS Score Color Yellow  Assess: if the MEWS score is Yellow or Red  Were vital signs taken at a resting state? Yes  Focused Assessment No change from prior assessment  Does the patient meet 2 or more of the SIRS criteria? No  Does the patient have a confirmed or suspected source of infection? No  MEWS guidelines implemented *See Row Information* No, previously yellow, continue vital signs every 4 hours  Notify: Charge Nurse/RN  Name of Charge Nurse/RN Notified Tom, RN  Date Charge Nurse/RN Notified 11/05/20  Time Charge Nurse/RN Notified 2030  Notify: Provider  Provider Name/Title Olena Heckle  Date Provider Notified 11/05/20  Time Provider Notified 2030  Notification Type Page  Notification Reason Other (Comment) (fever)  Provider response No new orders  Date of Provider Response 11/05/20  Time of Provider Response 2045  Assess: SIRS CRITERIA  SIRS Temperature  1  SIRS Pulse 1  SIRS Respirations  0  SIRS WBC 0  SIRS Score Sum  2

## 2020-11-05 NOTE — Progress Notes (Signed)
ANTICOAGULATION CONSULT NOTE - Initial Consult  Pharmacy Consult for heparin Indication: pulmonary embolus  Allergies  Allergen Reactions   Dilaudid [Hydromorphone Hcl] Shortness Of Breath    IV dilaudid causes nerve & muscle burning   Keflex [Cephalexin] Itching    States itching and c-diff   Nitrofurantoin Hives   Sulfa Antibiotics Hives    Patient Measurements: Height: '5\' 11"'$  (180.3 cm) Weight: 86.2 kg (190 lb) IBW/kg (Calculated) : 70.8 Heparin Dosing Weight: n/a. Use TBW = 86.2 kg  Vital Signs: BP: 111/71 (08/29 0600) Pulse Rate: 89 (08/29 0600)  Labs: Recent Labs    11/04/20 1829 11/05/20 0525  HGB 10.9* 10.3*  HCT 33.6* 32.0*  PLT 342 278  CREATININE 0.92 0.82     Estimated Creatinine Clearance: 118.6 mL/min (by C-G formula based on SCr of 0.82 mg/dL).   Medical History: Past Medical History:  Diagnosis Date   Anxiety    Asthma    C. difficile diarrhea 2014   Completed Flagyl   Kidney infection 07/27/12   Ovarian cyst    PONV (postoperative nausea and vomiting)    only from epidural hypotension   Stomach ulcer    in college    Assessment: 35 year old female who presents to the ED today with complaint of right-sided pain that began earlier yesterday with associated shortness of breath.  Recent tummy tuck, liposuction, breast lift and augmentation on Monday 10/29/20.  Pharmacy consulted to dose heparin for PE.  No prior AC noted.  Baseline Hgb: 10.9  Today, 11/05/20 HL <0.10 is undetectable on heparin infusion of 1400 units/hr CBC: Hgb low but stable; Plt WNL SCr WNL On room air Confirmed with RN that heparin infusing at correct rate. No interruptions/line issues. No overt signs of bleeding - noted that pt is on menstrual cycle.  Goal of Therapy:  Heparin level 0.3-0.7 units/ml Monitor platelets by anticoagulation protocol: Yes   Plan:  Heparin bolus of 2500 units IV once Increase heparin infusion to 1650 units/hr Check 6 hour HL HL, CBC  daily while on heparin infusion Monitor for signs of bleeding   Lenis Noon, PharmD 11/05/20 6:33 AM

## 2020-11-05 NOTE — Progress Notes (Signed)
Bilateral lower extremity venous duplex has been completed. Preliminary results can be found in CV Proc through chart review.   11/05/20 12:00 PM Carlos Levering RVT

## 2020-11-05 NOTE — Progress Notes (Signed)
[facility] for heparin>>apixaban Indication: pulmonary embolus  Allergies  Allergen Reactions   Dilaudid [Hydromorphone Hcl] Shortness Of Breath    IV dilaudid causes nerve & muscle burning   Keflex [Cephalexin] Itching    States itching and c-diff   Nitrofurantoin Hives   Sulfa Antibiotics Hives    Patient Measurements: Height: '5\' 11"'$  (180.3 cm) Weight: 86.2 kg (190 lb) IBW/kg (Calculated) : 70.8 Heparin Dosing Weight: n/a. Use TBW = 86.2 kg  Vital Signs: BP: 110/75 (08/29 0900) Pulse Rate: 95 (08/29 0900)  Labs: Recent Labs    11/04/20 1829 11/05/20 0525  HGB 10.9* 10.3*  HCT 33.6* 32.0*  PLT 342 278  HEPARINUNFRC  --  <0.10*  CREATININE 0.92 0.82     Estimated Creatinine Clearance: 118.6 mL/min (by C-G formula based on SCr of 0.82 mg/dL).   Medical History: Past Medical History:  Diagnosis Date   Anxiety    Asthma    C. difficile diarrhea 2014   Completed Flagyl   Kidney infection 07/27/12   Ovarian cyst    PONV (postoperative nausea and vomiting)    only from epidural hypotension   Stomach ulcer    in college    Assessment: 35 year old female who presents to the ED today with complaint of right-sided pain that began earlier yesterday with associated shortness of breath.  Recent tummy tuck, liposuction, breast lift and augmentation on Monday 10/29/20.  Pharmacy consulted to dose heparin for PE.  No prior AC noted.  Baseline Hgb: 10.9  Today, 11/05/20 CBC: Hgb low but stable; Plt WNL SCr WNL On room air To transition from UFH to apixaban x 3 months for provoked PE  Goal of Therapy:  Heparin level 0.3-0.7 units/ml Monitor platelets by anticoagulation protocol: Yes   Plan:  DC heparin drip & heparin levels & CBC Apixaban 10 mg po bid x 7 days followed by apixaban 5 mg po bid x 3 months Will provide 30 day free card & education prior to discharge  Eudelia Bunch, Pharm.D 11/05/2020 10:27 AM

## 2020-11-05 NOTE — Progress Notes (Signed)
PROGRESS NOTE    Patient: Deanna Norton                            PCP: Jonathon Jordan, MD                    DOB: [birth year]            DOA: 11/04/2020 [id number]             DOS: 11/05/2020, 10:00 AM   LOS: 0 days   Date of Service: The patient was seen and examined on 11/05/2020  Subjective:   The patient was seen and examined this morning. Stable at this time. Still complaining of : Significant pain from the surgical wounds, and back pain Shortness of breath on exertion, currently satting 100% on room air   Brief Narrative:   Deanna Norton is a 35 y.o. female with medical history significant for remote history biliary atresia s/p cholecystectomy, status post tummy tuck surgery, bilateral breast lift and augmentation in [facility] with plastic surgeon last Monday...presents with concerns of right flank/lower back pain.  Work-up, CTA of the chest revealed right lower lobe subsegmental PE with no right heart strain.    Assessment & Plan:   Principal Problem:   Pulmonary embolism (HCC) Active Problems:   History of elective breast augmentation   Anemia  Acute right lower subsegmental PE -Currently on heparin drip -Patient denies any chest pain, mild shortness of breath with exertion, currently satting 100% on room air -Chest was reviewed, positive for right lower lobe subsegmental PE, negative for any right heart strain -Discussed with patient, patient will be switched over to Eliquis later today Since this is a provoked PE we recommend 3 months of anticoagulation therapy -Obtaining bilateral lower extremity ultrasound to rule out DVT -Continue as needed analgesics for significant pain and discomfort   Status post abdominal surgery with breast lift and augmentation -Status plastic surgery on 10/29/2020 -Positive JP drain on the right, draining serosanguineous nodes to be removed 11/06/2020 -Surgical wounds bilateral breast area, with hematoma, diffuse abdominal  tenderness With no signs of inflammation or infection -CT reviewed showing postoperative changes, negative for any acute concerning finding -Wound care consultation, as needed analgesics for extensive pain and discomfort   Anemia -Mild likely postop mild blood loss, -Monitoring H&H, currently stable -Pending iron studies, [id number] and folic acid   Skin Assessment: I have examined the patient's skin surgical wounds, and I agree with the wound assessment as performed by wound care team  ------------------------------------------------------------------------------------------------------------------------------ Cultures; None  Antimicrobials: On p.o. antibiotics of Levaquin   Consultants: Wound care consult   ---------------------------------------------------------------------------------------------------------------------------------  DVT prophylaxis:  On heparin drip anticipating switching to p.o. Eliquis today Code Status:   Code Status: Full Code  Family Communication: Alert oriented x4 discussed with patient No family member present at bedside- The above findings and plan of care has been discussed with patient (and family)  in detail,  they expressed understanding and agreement of above. -Advance care planning has been discussed.   Admission status:   Status is: Observation  The patient remains OBS appropriate and will d/c before 2 midnights.  Dispo: The patient is from: Home              Anticipated d/c is to: Home in 1-2 days, switching patient from heparin drip to p.o. Eliquis,  More pain control, wound care consultation,  Patient currently is not medically stable to d/c.   Difficult to place patient No      Level of care: Telemetry   Procedures:   No admission procedures for hospital encounter.    Antimicrobials:  Anti-infectives (From admission, onward)    Start     Dose/Rate Route Frequency Ordered Stop   11/05/20 1015   levofloxacin (LEVAQUIN) tablet 500 mg        500 mg Oral Daily 11/05/20 1000          Medication:   gabapentin  300 mg Oral BID   levofloxacin  500 mg Oral Daily    acetaminophen, morphine injection, morphine injection, ondansetron, oxyCODONE   Objective:   Vitals:   11/05/20 0730 11/05/20 0800 11/05/20 0830 11/05/20 0900  BP: 108/67 113/77 109/81 110/75  Pulse: 96 (!) 104 100 95  Resp: (!) 23 14 (!) 22 16  Temp:      TempSrc:      SpO2: 99% 99% 99% 100%  Weight:      Height:        Intake/Output Summary (Last 24 hours) at 11/05/2020 1000 Last data filed at 11/05/2020 0845 Gross per 24 hour  Intake 206.6 ml  Output --  Net 206.6 ml   Filed Weights   11/04/20 1732  Weight: 86.2 kg     Examination:   Physical Exam  Constitution:  Alert, cooperative, no distress,  Appears calm and comfortable  Psychiatric: Normal and stable mood and affect, cognition intact,   HEENT: Normocephalic, PERRL, otherwise with in Normal limits  Chest:Chest symmetric Cardio vascular:  S1/S2, RRR, No murmure, No Rubs or Gallops  pulmonary: Clear to auscultation bilaterally, respirations unlabored, negative wheezes / crackles Abdomen: Soft, non-tender, non-distended, bowel sounds,no masses, no organomegaly Muscular skeletal: Limited exam - in bed, able to move all 4 extremities, Normal strength,  Neuro: CNII-XII intact. , normal motor and sensation, reflexes intact  Extremities: No pitting edema lower extremities, +2 pulses, subjective back pain midthoracic Skin: Warm to touch, bilateral augmented breast with surgical scar right nipple and hematoma-bilateral mild tenderness..  Diffuse abdominal tenderness, abdominal hematoma with JP drain on the right side, draining serosanguineous fluid.. Wounds: Postsurgical wounds as described above--no erythema,  positive for edema, tenderness, hematoma, JP drain in place no signs of infection     ------------------------------------------------------------------------------------------------------------------------------------------    LABs:  CBC Latest Ref Rng & Units 11/05/2020 11/04/2020 03/21/2017  WBC 4.0 - 10.5 K/uL 8.2 12.2(H) 9.0  Hemoglobin 12.0 - 15.0 g/dL 10.3(L) 10.9(L) 12.4  Hematocrit 36.0 - 46.0 % 32.0(L) 33.6(L) 37.2  Platelets 150 - 400 K/uL 278 342 233   CMP Latest Ref Rng & Units 11/05/2020 11/04/2020 01/05/2016  Glucose 70 - 99 mg/dL 113(H) 134(H) 114(H)  BUN 6 - 20 mg/dL '9 13 18  '$ Creatinine 0.44 - 1.00 mg/dL 0.82 0.92 1.18(H)  Sodium 135 - 145 mmol/L 135 139 139  Potassium 3.5 - 5.1 mmol/L 4.1 4.0 3.6  Chloride 98 - 111 mmol/L 104 107 107  CO2 22 - 32 mmol/L '24 25 25  '$ Calcium 8.9 - 10.3 mg/dL 8.5(L) 8.8(L) 8.7(L)  Total Protein 6.5 - 8.1 g/dL - 7.1 6.9  Total Bilirubin 0.3 - 1.2 mg/dL - 1.0 0.6  Alkaline Phos 38 - 126 U/L - 45 57  AST 15 - 41 U/L - 21 17  ALT 0 - 44 U/L - 40 20       Micro Results Recent Results (from the past 240 hour(s))  SARS CORONAVIRUS  2 (TAT 6-24 HRS) Nasopharyngeal Nasopharyngeal Swab     Status: None   Collection Time: 11/04/20 10:09 PM   Specimen: Nasopharyngeal Swab  Result Value Ref Range Status   SARS Coronavirus 2 NEGATIVE NEGATIVE Final    Comment: (NOTE) SARS-CoV-2 target nucleic acids are NOT DETECTED.  The SARS-CoV-2 RNA is generally detectable in upper and lower respiratory specimens during the acute phase of infection. Negative results do not preclude SARS-CoV-2 infection, do not rule out co-infections with other pathogens, and should not be used as the sole basis for treatment or other patient management decisions. Negative results must be combined with clinical observations, patient history, and epidemiological information. The expected result is Negative.  Fact Sheet for Patients: SugarRoll.be  Fact Sheet for Healthcare  Providers: https://www.woods-mathews.com/  This test is not yet approved or cleared by the [place] FDA and  has been authorized for detection and/or diagnosis of SARS-CoV-2 by FDA under an Emergency Use Authorization (EUA). This EUA will remain  in effect (meaning this test can be used) for the duration of the COVID-19 declaration under Se ction 564(b)(1) of the Act, 21 U.S.C. section 360bbb-3(b)(1), unless the authorization is terminated or revoked sooner.  Performed at [facility], [place] [address] [place], [place] [address]     Radiology Reports CT Angio Chest PE W/Cm &/Or Wo Cm  Result Date: 11/04/2020 CLINICAL DATA:  Shortness of breath. Right flank pain. Suspected pulmonary embolus. Recent plastic surgery. Tummy tuck. Liposuction. Breast lift. Breast augmentation. EXAM: CT ANGIOGRAPHY CHEST CT ABDOMEN AND PELVIS WITH CONTRAST TECHNIQUE: Multidetector CT imaging of the chest was performed using the standard protocol during bolus administration of intravenous contrast. Multiplanar CT image reconstructions and MIPs were obtained to evaluate the vascular anatomy. Multidetector CT imaging of the abdomen and pelvis was performed using the standard protocol during bolus administration of intravenous contrast. CONTRAST:  177m OMNIPAQUE IOHEXOL 350 MG/ML SOLN COMPARISON:  CT chest 08/23/2019, CT abdomen pelvis 08/19/2018 FINDINGS: CTA CHEST FINDINGS Cardiovascular: Satisfactory opacification of the pulmonary arteries to the segmental level. Right lower lobe subsegmental pulmonary artery defect. The main pulmonary artery is normal in caliber. Normal heart size. Normal ventricular ratio. No pericardial effusion. Mediastinum/Nodes: No enlarged mediastinal, hilar, or axillary lymph nodes. Thyroid gland, trachea, and esophagus demonstrate no significant findings. Lungs/Pleura: Right lower lobe atelectasis. No focal consolidation. No pulmonary nodule. No pulmonary mass. No pleural  effusion. No pneumothorax. No definite pulmonary infarction identified. Musculoskeletal: Bilateral breast implants. Mild subcutaneus soft tissue edema and emphysema likely postsurgical in etiology. Associated skin thickening bilaterally. No suspicious lytic or blastic osseous lesions. No acute displaced fracture. Review of the MIP images confirms the above findings. CT ABDOMEN and PELVIS FINDINGS Hepatobiliary: No focal liver abnormality. Status post cholecystectomy. No biliary dilatation. Pancreas: No focal lesion. Normal pancreatic contour. No surrounding inflammatory changes. No main pancreatic ductal dilatation. Spleen: Normal in size without focal abnormality. Adrenals/Urinary Tract: No adrenal nodule bilaterally. Bilateral kidneys enhance symmetrically. No hydronephrosis. No hydroureter. The urinary bladder is unremarkable. Stomach/Bowel: Stomach is within normal limits. No evidence of bowel wall thickening or dilatation. Appendix appears normal. Vascular/Lymphatic: No abdominal aorta or iliac aneurysm. No abdominal, pelvic, or inguinal lymphadenopathy. Reproductive: Uterus and bilateral adnexa are unremarkable. Other: No intraperitoneal free fluid. No intraperitoneal free gas. No organized fluid collection. Musculoskeletal: Diffuse subcutaneus soft tissue edema and emphysema likely postsurgical in etiology. No organized fluid collection. A JP drain is noted within the subcutaneus soft tissues of the abdomen the level of the  plication/surgical changes. No suspicious lytic or blastic osseous lesions. No acute displaced fracture. Review of the MIP images confirms the above findings. IMPRESSION: 1. Right lower lobe subsegmental pulmonary embolus. No definite pulmonary infarction. No right heart strain. 2. Mild subcutaneus soft tissue edema and emphysema of the bilateral breasts with associated overlying dermal thickening likely postsurgical in etiology status post breast surgery. No organized fluid collection. 3.  Mild subcutaneus soft tissue edema and emphysema along the anterior abdomen likely postsurgical. No organized fluid collection. 4. No acute intra-abdominal or intrapelvic abnormality. These results were called by telephone at the time of interpretation on 11/04/2020 at 9:48 pm to provider MARGAUX VENTER , who verbally acknowledged these results. Electronically Signed   By: Iven Finn M.D.   On: 11/04/2020 21:54   CT Abdomen Pelvis W Contrast  Result Date: 11/04/2020 CLINICAL DATA:  Shortness of breath. Right flank pain. Suspected pulmonary embolus. Recent plastic surgery. Tummy tuck. Liposuction. Breast lift. Breast augmentation. EXAM: CT ANGIOGRAPHY CHEST CT ABDOMEN AND PELVIS WITH CONTRAST TECHNIQUE: Multidetector CT imaging of the chest was performed using the standard protocol during bolus administration of intravenous contrast. Multiplanar CT image reconstructions and MIPs were obtained to evaluate the vascular anatomy. Multidetector CT imaging of the abdomen and pelvis was performed using the standard protocol during bolus administration of intravenous contrast. CONTRAST:  152m OMNIPAQUE IOHEXOL 350 MG/ML SOLN COMPARISON:  CT chest 08/23/2019, CT abdomen pelvis 08/19/2018 FINDINGS: CTA CHEST FINDINGS Cardiovascular: Satisfactory opacification of the pulmonary arteries to the segmental level. Right lower lobe subsegmental pulmonary artery defect. The main pulmonary artery is normal in caliber. Normal heart size. Normal ventricular ratio. No pericardial effusion. Mediastinum/Nodes: No enlarged mediastinal, hilar, or axillary lymph nodes. Thyroid gland, trachea, and esophagus demonstrate no significant findings. Lungs/Pleura: Right lower lobe atelectasis. No focal consolidation. No pulmonary nodule. No pulmonary mass. No pleural effusion. No pneumothorax. No definite pulmonary infarction identified. Musculoskeletal: Bilateral breast implants. Mild subcutaneus soft tissue edema and emphysema likely  postsurgical in etiology. Associated skin thickening bilaterally. No suspicious lytic or blastic osseous lesions. No acute displaced fracture. Review of the MIP images confirms the above findings. CT ABDOMEN and PELVIS FINDINGS Hepatobiliary: No focal liver abnormality. Status post cholecystectomy. No biliary dilatation. Pancreas: No focal lesion. Normal pancreatic contour. No surrounding inflammatory changes. No main pancreatic ductal dilatation. Spleen: Normal in size without focal abnormality. Adrenals/Urinary Tract: No adrenal nodule bilaterally. Bilateral kidneys enhance symmetrically. No hydronephrosis. No hydroureter. The urinary bladder is unremarkable. Stomach/Bowel: Stomach is within normal limits. No evidence of bowel wall thickening or dilatation. Appendix appears normal. Vascular/Lymphatic: No abdominal aorta or iliac aneurysm. No abdominal, pelvic, or inguinal lymphadenopathy. Reproductive: Uterus and bilateral adnexa are unremarkable. Other: No intraperitoneal free fluid. No intraperitoneal free gas. No organized fluid collection. Musculoskeletal: Diffuse subcutaneus soft tissue edema and emphysema likely postsurgical in etiology. No organized fluid collection. A JP drain is noted within the subcutaneus soft tissues of the abdomen the level of the plication/surgical changes. No suspicious lytic or blastic osseous lesions. No acute displaced fracture. Review of the MIP images confirms the above findings. IMPRESSION: 1. Right lower lobe subsegmental pulmonary embolus. No definite pulmonary infarction. No right heart strain. 2. Mild subcutaneus soft tissue edema and emphysema of the bilateral breasts with associated overlying dermal thickening likely postsurgical in etiology status post breast surgery. No organized fluid collection. 3. Mild subcutaneus soft tissue edema and emphysema along the anterior abdomen likely postsurgical. No organized fluid collection. 4. No acute intra-abdominal or intrapelvic  abnormality.  These results were called by telephone at the time of interpretation on 11/04/2020 at 9:48 pm to provider MARGAUX VENTER , who verbally acknowledged these results. Electronically Signed   By: Iven Finn M.D.   On: 11/04/2020 21:54    SIGNED: Deatra James, MD, FHM. [facility],  Pager (please use [email or web address] to page/text) Please use Epic Secure Chat for non-urgent communication (7AM-7PM)  If 7PM-7AM, please contact night-coverage [email or web address], 11/05/2020, 10:00 AM

## 2020-11-05 NOTE — Discharge Instructions (Signed)
Information on my medicine - ELIQUIS (apixaban)  This medication education was reviewed with me or my healthcare representative as part of my discharge preparation.  Why was Eliquis prescribed for you? Eliquis was prescribed to treat blood clots that may have been found in the veins of your legs (deep vein thrombosis) or in your lungs (pulmonary embolism) and to reduce the risk of them occurring again.  What do You need to know about Eliquis ? The starting dose is 10 mg (two 5 mg tablets) taken TWICE daily for the FIRST SEVEN (7) DAYS, then on Monday 11/12/2020  the dose is reduced to ONE 5 mg tablet taken TWICE daily.  Eliquis may be taken with or without food.   Try to take the dose about the same time in the morning and in the evening. If you have difficulty swallowing the tablet whole please discuss with your pharmacist how to take the medication safely.  Take Eliquis exactly as prescribed and DO NOT stop taking Eliquis without talking to the doctor who prescribed the medication.  Stopping may increase your risk of developing a new blood clot.  Refill your prescription before you run out.  After discharge, you should have regular check-up appointments with your healthcare provider that is prescribing your Eliquis.    What do you do if you miss a dose? If a dose of ELIQUIS is not taken at the scheduled time, take it as soon as possible on the same day and twice-daily administration should be resumed. The dose should not be doubled to make up for a missed dose.  Important Safety Information A possible side effect of Eliquis is bleeding. You should call your healthcare provider right away if you experience any of the following: Bleeding from an injury or your nose that does not stop. Unusual colored urine (red or dark brown) or unusual colored stools (red or black). Unusual bruising for unknown reasons. A serious fall or if you hit your head (even if there is no bleeding).  Some  medicines may interact with Eliquis and might increase your risk of bleeding or clotting while on Eliquis. To help avoid this, consult your healthcare provider or pharmacist prior to using any new prescription or non-prescription medications, including herbals, vitamins, non-steroidal anti-inflammatory drugs (NSAIDs) and supplements.  This website has more information on Eliquis (apixaban): http://www.eliquis.com/eliquis/home

## 2020-11-05 NOTE — H&P (Addendum)
History and Physical    Deanna Norton [id number] DOB: 1986/03/03 DOA: 11/04/2020  PCP: Jonathon Jordan, MD  Patient coming from: HOME, friend at bedside  I have personally briefly reviewed patient's old medical records in [place]  Chief Complaint: right flank pain  HPI: Deanna Norton is a 35 y.o. female with medical history significant for remote history biliary atresia s/p cholecystectomy who presents with concerns of right flank/lower back pain.  About a week ago patient had a tummy tuck, breast lift and augmentation done in [place] with plastic surgery.  She had been laying in bed more and gradually developed some lower back pain and also noted increasing shortness of breath.  On day 2 of her surgery, she also noticed some increase lower extremity swelling and had been wearing compression stockings. Denies any calf pain.  Today she developed excruciating right flank/lower back pain as well as a low-grade temperature and called her surgeon who advised her to present to the ED.  In the ED, she was afebrile mildly tachycardic and mildly hypertensive on room air.  CTA of the chest obtained showed right lower lobe subsegmental PE with no right heart strain. Patient denies previous history of blood clots.  Denies tobacco or use of any hormonal contraceptives or therapy.  WBC of 12, hemoglobin of 10.9 and patient reports being on her menstrual cycle now.  Lactate of 1.1.  BG of 134.  She was started on IV heparin infusion by ED PA.  Hospitalist then called for admission.  Review of Systems: Constitutional: No Weight Change, No Fever ENT/Mouth: No sore throat, No Rhinorrhea Eyes: No Eye Pain, No Vision Changes Cardiovascular: No Chest Pain, + SOB, + Edema, No Palpitations Respiratory: No Cough, No Sputum, No Wheezing, no Dyspnea  Gastrointestinal: No Nausea, No Vomiting, No Diarrhea, No Constipation, No Pain Genitourinary: no Urinary Incontinence, No Urgency,+ Flank  Pain Musculoskeletal: No Arthralgias, No Myalgias Skin: No Skin Lesions, No Pruritus, Neuro: no Weakness, No Numbness Psych: No Anxiety/Panic, No Depression, no decrease appetite Heme/Lymph: No Bruising, No Bleeding  Past Medical History:  Diagnosis Date   Anxiety    Asthma    C. difficile diarrhea 2014   Completed Flagyl   Kidney infection 07/27/12   Ovarian cyst    PONV (postoperative nausea and vomiting)    only from epidural hypotension   Stomach ulcer    in college    Past Surgical History:  Procedure Laterality Date   BREAST ENHANCEMENT SURGERY     CHOLECYSTECTOMY  02/2014   EYE SURGERY     LIPOSUCTION       reports that she quit smoking about 7 years ago. Her smoking use included cigarettes. She smoked an average of .25 packs per day. She has never used smokeless tobacco. She reports current alcohol use. She reports that she does not use drugs. Social History  Allergies  Allergen Reactions   Dilaudid [Hydromorphone Hcl] Shortness Of Breath    IV dilaudid causes nerve & muscle burning   Keflex [Cephalexin] Itching    States itching and c-diff   Nitrofurantoin Hives   Sulfa Antibiotics Hives    Family History  Problem Relation Age of Onset   Diabetes Mother    Thyroid disease Mother    Heart attack Father        Deceased    Alcohol abuse Father    Heart disease Father    Hypertension Father    Mental illness Father    Breast cancer  Maternal Grandmother    Hearing loss Paternal Grandfather        MI - 67    Hypertension Paternal Grandfather    Heart disease Paternal Grandfather    Stomach cancer Paternal Grandmother    Thyroid disease Paternal Grandmother      Prior to Admission medications   Medication Sig Start Date End Date Taking? Authorizing Provider  gabapentin (NEURONTIN) 300 MG capsule Take 300 mg by mouth daily as needed (nerve pain). 10/05/20  Yes [provider]  levofloxacin (LEVAQUIN) 500 MG tablet Take 500 mg by mouth daily.  11/04/20  Yes [provider]  ondansetron (ZOFRAN) 4 MG tablet Take 4 mg by mouth every 8 (eight) hours as needed for nausea or vomiting. 10/05/20  Yes [provider]  oxyCODONE (OXY IR/ROXICODONE) 5 MG immediate release tablet Take 5-10 mg by mouth every 6 (six) hours as needed for severe pain. 10/05/20  Yes [provider]    Physical Exam: Vitals:   11/04/20 2030 11/04/20 2203 11/04/20 2300 11/05/20 0000  BP: 107/70 122/79 112/77 118/73  Pulse: 86 (!) 101 95 (!) 103  Resp: '17 16 20 14  '$ Temp:      TempSrc:      SpO2: 100% 100% 100% 100%  Weight:      Height:        Constitutional: Obese young female laying on left side in apparent discomfort due to pain Vitals:   11/04/20 2030 11/04/20 2203 11/04/20 2300 11/05/20 0000  BP: 107/70 122/79 112/77 118/73  Pulse: 86 (!) 101 95 (!) 103  Resp: '17 16 20 14  '$ Temp:      TempSrc:      SpO2: 100% 100% 100% 100%  Weight:      Height:       Eyes: PERRL, lids and conjunctivae normal ENMT: Mucous membranes are moist.  Neck: normal, supple Respiratory: clear to auscultation bilaterally, no wheezing, no crackles. Normal respiratory effort. No accessory muscle use.  Cardiovascular: Regular rate and rhythm, no murmurs / rubs / gallops.  Nonpitting edema of distal bilateral lower extremity.   Abdomen: Has an abdominal binder with surrounding diffuse ecchymosis to the abdomen and right flank-tender with light palpation.  JP drain in place in right inguinal area.  musculoskeletal: no clubbing / cyanosis. No joint deformity upper and lower extremities.  Normal muscle tone.  Skin: no rashes, lesions, ulcers. No induration Neurologic: CN 2-12 grossly intact. Sensation intact. Strength 5/5 in all 4.  Psychiatric: Normal judgment and insight. Alert and oriented x 3. Normal mood.     Labs on Admission: I have personally reviewed following labs and imaging studies  CBC: Recent Labs  Lab 11/04/20 1829  WBC 12.2*   NEUTROABS 9.7*  HGB 10.9*  HCT 33.6*  MCV 91.1  PLT [id number]   Basic Metabolic Panel: Recent Labs  Lab 11/04/20 1829  NA 139  K 4.0  CL 107  CO2 25  GLUCOSE 134*  BUN 13  CREATININE 0.92  CALCIUM 8.8*   GFR: Estimated Creatinine Clearance: 105.7 mL/min (by C-G formula based on SCr of 0.92 mg/dL). Liver Function Tests: Recent Labs  Lab 11/04/20 1829  AST 21  ALT 40  ALKPHOS 45  BILITOT 1.0  PROT 7.1  ALBUMIN 3.5   Recent Labs  Lab 11/04/20 1829  LIPASE 30   No results for input(s): AMMONIA in the last 168 hours. Coagulation Profile: No results for input(s): INR, PROTIME in the last 168 hours. Cardiac Enzymes: No  results for input(s): CKTOTAL, CKMB, CKMBINDEX, TROPONINI in the last 168 hours. BNP (last 3 results) No results for input(s): PROBNP in the last 8760 hours. HbA1C: No results for input(s): HGBA1C in the last 72 hours. CBG: No results for input(s): GLUCAP in the last 168 hours. Lipid Profile: No results for input(s): CHOL, HDL, LDLCALC, TRIG, CHOLHDL, LDLDIRECT in the last 72 hours. Thyroid Function Tests: No results for input(s): TSH, T4TOTAL, FREET4, T3FREE, THYROIDAB in the last 72 hours. Anemia Panel: No results for input(s): VITAMINB12, FOLATE, FERRITIN, TIBC, IRON, RETICCTPCT in the last 72 hours. Urine analysis:    Component Value Date/Time   COLORURINE AMBER (A) 11/04/2020 1749   APPEARANCEUR HAZY (A) 11/04/2020 1749   LABSPEC 1.043 (H) 11/04/2020 1749   PHURINE 6.0 11/04/2020 1749   GLUCOSEU NEGATIVE 11/04/2020 1749   HGBUR LARGE (A) 11/04/2020 1749   BILIRUBINUR NEGATIVE 11/04/2020 1749   BILIRUBINUR n 03/20/2015 1434   KETONESUR NEGATIVE 11/04/2020 1749   PROTEINUR 30 (A) 11/04/2020 1749   UROBILINOGEN 0.2 03/20/2015 1434   UROBILINOGEN 0.2 08/29/2013 0253   NITRITE NEGATIVE 11/04/2020 1749   LEUKOCYTESUR TRACE (A) 11/04/2020 1749    Radiological Exams on Admission: CT Angio Chest PE W/Cm &/Or Wo Cm  Result Date:  11/04/2020 CLINICAL DATA:  Shortness of breath. Right flank pain. Suspected pulmonary embolus. Recent plastic surgery. Tummy tuck. Liposuction. Breast lift. Breast augmentation. EXAM: CT ANGIOGRAPHY CHEST CT ABDOMEN AND PELVIS WITH CONTRAST TECHNIQUE: Multidetector CT imaging of the chest was performed using the standard protocol during bolus administration of intravenous contrast. Multiplanar CT image reconstructions and MIPs were obtained to evaluate the vascular anatomy. Multidetector CT imaging of the abdomen and pelvis was performed using the standard protocol during bolus administration of intravenous contrast. CONTRAST:  19m OMNIPAQUE IOHEXOL 350 MG/ML SOLN COMPARISON:  CT chest 08/23/2019, CT abdomen pelvis 08/19/2018 FINDINGS: CTA CHEST FINDINGS Cardiovascular: Satisfactory opacification of the pulmonary arteries to the segmental level. Right lower lobe subsegmental pulmonary artery defect. The main pulmonary artery is normal in caliber. Normal heart size. Normal ventricular ratio. No pericardial effusion. Mediastinum/Nodes: No enlarged mediastinal, hilar, or axillary lymph nodes. Thyroid gland, trachea, and esophagus demonstrate no significant findings. Lungs/Pleura: Right lower lobe atelectasis. No focal consolidation. No pulmonary nodule. No pulmonary mass. No pleural effusion. No pneumothorax. No definite pulmonary infarction identified. Musculoskeletal: Bilateral breast implants. Mild subcutaneus soft tissue edema and emphysema likely postsurgical in etiology. Associated skin thickening bilaterally. No suspicious lytic or blastic osseous lesions. No acute displaced fracture. Review of the MIP images confirms the above findings. CT ABDOMEN and PELVIS FINDINGS Hepatobiliary: No focal liver abnormality. Status post cholecystectomy. No biliary dilatation. Pancreas: No focal lesion. Normal pancreatic contour. No surrounding inflammatory changes. No main pancreatic ductal dilatation. Spleen: Normal in size  without focal abnormality. Adrenals/Urinary Tract: No adrenal nodule bilaterally. Bilateral kidneys enhance symmetrically. No hydronephrosis. No hydroureter. The urinary bladder is unremarkable. Stomach/Bowel: Stomach is within normal limits. No evidence of bowel wall thickening or dilatation. Appendix appears normal. Vascular/Lymphatic: No abdominal aorta or iliac aneurysm. No abdominal, pelvic, or inguinal lymphadenopathy. Reproductive: Uterus and bilateral adnexa are unremarkable. Other: No intraperitoneal free fluid. No intraperitoneal free gas. No organized fluid collection. Musculoskeletal: Diffuse subcutaneus soft tissue edema and emphysema likely postsurgical in etiology. No organized fluid collection. A JP drain is noted within the subcutaneus soft tissues of the abdomen the level of the plication/surgical changes. No suspicious lytic or blastic osseous lesions. No acute displaced fracture. Review of the MIP images confirms the  above findings. IMPRESSION: 1. Right lower lobe subsegmental pulmonary embolus. No definite pulmonary infarction. No right heart strain. 2. Mild subcutaneus soft tissue edema and emphysema of the bilateral breasts with associated overlying dermal thickening likely postsurgical in etiology status post breast surgery. No organized fluid collection. 3. Mild subcutaneus soft tissue edema and emphysema along the anterior abdomen likely postsurgical. No organized fluid collection. 4. No acute intra-abdominal or intrapelvic abnormality. These results were called by telephone at the time of interpretation on 11/04/2020 at 9:48 pm to provider MARGAUX VENTER , who verbally acknowledged these results. Electronically Signed   By: Iven Finn M.D.   On: 11/04/2020 21:54   CT Abdomen Pelvis W Contrast  Result Date: 11/04/2020 CLINICAL DATA:  Shortness of breath. Right flank pain. Suspected pulmonary embolus. Recent plastic surgery. Tummy tuck. Liposuction. Breast lift. Breast augmentation.  EXAM: CT ANGIOGRAPHY CHEST CT ABDOMEN AND PELVIS WITH CONTRAST TECHNIQUE: Multidetector CT imaging of the chest was performed using the standard protocol during bolus administration of intravenous contrast. Multiplanar CT image reconstructions and MIPs were obtained to evaluate the vascular anatomy. Multidetector CT imaging of the abdomen and pelvis was performed using the standard protocol during bolus administration of intravenous contrast. CONTRAST:  158m OMNIPAQUE IOHEXOL 350 MG/ML SOLN COMPARISON:  CT chest 08/23/2019, CT abdomen pelvis 08/19/2018 FINDINGS: CTA CHEST FINDINGS Cardiovascular: Satisfactory opacification of the pulmonary arteries to the segmental level. Right lower lobe subsegmental pulmonary artery defect. The main pulmonary artery is normal in caliber. Normal heart size. Normal ventricular ratio. No pericardial effusion. Mediastinum/Nodes: No enlarged mediastinal, hilar, or axillary lymph nodes. Thyroid gland, trachea, and esophagus demonstrate no significant findings. Lungs/Pleura: Right lower lobe atelectasis. No focal consolidation. No pulmonary nodule. No pulmonary mass. No pleural effusion. No pneumothorax. No definite pulmonary infarction identified. Musculoskeletal: Bilateral breast implants. Mild subcutaneus soft tissue edema and emphysema likely postsurgical in etiology. Associated skin thickening bilaterally. No suspicious lytic or blastic osseous lesions. No acute displaced fracture. Review of the MIP images confirms the above findings. CT ABDOMEN and PELVIS FINDINGS Hepatobiliary: No focal liver abnormality. Status post cholecystectomy. No biliary dilatation. Pancreas: No focal lesion. Normal pancreatic contour. No surrounding inflammatory changes. No main pancreatic ductal dilatation. Spleen: Normal in size without focal abnormality. Adrenals/Urinary Tract: No adrenal nodule bilaterally. Bilateral kidneys enhance symmetrically. No hydronephrosis. No hydroureter. The urinary bladder  is unremarkable. Stomach/Bowel: Stomach is within normal limits. No evidence of bowel wall thickening or dilatation. Appendix appears normal. Vascular/Lymphatic: No abdominal aorta or iliac aneurysm. No abdominal, pelvic, or inguinal lymphadenopathy. Reproductive: Uterus and bilateral adnexa are unremarkable. Other: No intraperitoneal free fluid. No intraperitoneal free gas. No organized fluid collection. Musculoskeletal: Diffuse subcutaneus soft tissue edema and emphysema likely postsurgical in etiology. No organized fluid collection. A JP drain is noted within the subcutaneus soft tissues of the abdomen the level of the plication/surgical changes. No suspicious lytic or blastic osseous lesions. No acute displaced fracture. Review of the MIP images confirms the above findings. IMPRESSION: 1. Right lower lobe subsegmental pulmonary embolus. No definite pulmonary infarction. No right heart strain. 2. Mild subcutaneus soft tissue edema and emphysema of the bilateral breasts with associated overlying dermal thickening likely postsurgical in etiology status post breast surgery. No organized fluid collection. 3. Mild subcutaneus soft tissue edema and emphysema along the anterior abdomen likely postsurgical. No organized fluid collection. 4. No acute intra-abdominal or intrapelvic abnormality. These results were called by telephone at the time of interpretation on 11/04/2020 at 9:48 pm to provider MARGAUX VENTER ,  who verbally acknowledged these results. Electronically Signed   By: Iven Finn M.D.   On: 11/04/2020 21:54      Assessment/Plan  Right lower subsegmental PE status post recent augmentation surgeries - Continue IV heparin infusion overnight and convert to oral tomorrow - Continues to have persistent lower extremity edema.  Will obtain bilateral venous Doppler ultrasound  Abdominal and breast augmentation surgery -performed on 8/22 -has JP drain in place. Plans to follow up with surgery for  removal on 8/30 -CT only showed post-operative changes  -increase pain likely post-surgical pain. PRN IV morphine.  Anemia -likely due to post-op blood loss and currently on menstral cycle -check iron, vitamin [id number] and folic   DVT prophylaxis:.IV heparin infusion  code Status: Full Family Communication: Plan discussed with patient and friend at bedside  disposition Plan: Home with observation Consults called:  Admission status: Observation  Level of care: Telemetry  Status is: Observation  The patient remains OBS appropriate and will d/c before 2 midnights.  Dispo: The patient is from: Home              Anticipated d/c is to: Home              Patient currently is not medically stable to d/c.   Difficult to place patient No         Orene Desanctis DO [facility]   If 7PM-7AM, please contact night-coverage [email or web address]   11/05/2020, 12:09 AM

## 2020-11-06 DIAGNOSIS — I2693 Single subsegmental pulmonary embolism without acute cor pulmonale: Secondary | ICD-10-CM | POA: Diagnosis not present

## 2020-11-06 MED ORDER — GABAPENTIN 300 MG PO CAPS: 300.0000 mg | ORAL_CAPSULE | Freq: Two times a day (BID) | ORAL | 1 refills | Status: AC

## 2020-11-06 MED ORDER — APIXABAN 5 MG PO TABS: 5.0000 mg | ORAL_TABLET | Freq: Two times a day (BID) | ORAL | 2 refills | Status: AC

## 2020-11-06 MED ORDER — APIXABAN 5 MG PO TABS: ORAL_TABLET | ORAL | 0 refills | Status: AC

## 2020-11-06 NOTE — TOC Benefit Eligibility Note (Signed)
Transition of Care [facility]) Benefit Eligibility Note    Patient Details  Name: Deanna Norton MRN: [id number] Date of Birth: Sep 02, 1985   Medication/Dose: Eliquis 2.5 and $Remov'5mg'xeBaXP$   Covered?: Yes  Tier: 2 Drug  Prescription Coverage Preferred Pharmacy: local  Spoke with Person/Company/Phone Number:: Gary Fleet [employer] [phone]  Co-Pay: $110.00  Prior Approval: No  Deductible: Met       Kerin Salen Phone Number: 11/06/2020, 2:51 PM

## 2020-11-06 NOTE — Evaluation (Signed)
Physical Therapy One Time Evaluation Patient Details Name: Deanna Norton MRN: [id number] DOB: Jun 19, 1985 Today's Date: 11/06/2020   History of Present Illness  35 y.o. female with medical history significant for remote history biliary atresia s/p cholecystectomy, status post tummy tuck surgery, bilateral breast lift and augmentation in [facility] with plastic surgeon last Monday...presents with concerns of right flank/lower back pain.  Work-up, CTA of the chest revealed right lower lobe subsegmental PE with no right heart strain.  Clinical Impression  Patient evaluated by Physical Therapy with no further acute PT needs identified. All education has been completed and the patient has no further questions. Pt typically very active, reports running 4 miles prior to her plastic surgery.  Pt has only really been ambulating to/from bathroom s/p surgery. Pt encouraged to ambulate as tolerated and initially with nursing as she did get a little lightheaded with ambulation today.  Pt plans to d/c home to her mother-in-laws.  See below for any follow-up Physical Therapy or equipment needs. PT is signing off. Thank you for this referral.     Follow Up Recommendations No PT follow up    Equipment Recommendations  None recommended by PT    Recommendations for Other Services       Precautions / Restrictions Precautions Precaution Comments: R JP drain      Mobility  Bed Mobility Overal bed mobility: Modified Independent                  Transfers Overall transfer level: Modified independent                  Ambulation/Gait Ambulation/Gait assistance: Supervision;Min guard Gait Distance (Feet): 400 Feet Assistive device: None Gait Pattern/deviations: Step-through pattern;Decreased stride length     General Gait Details: verbal cue for taking breaks as needed (pt has not ambulated much since after plastic surgery), pt required seated rest break due to fatigue and mild dizziness; BP  110/72 mmHg upon return to room, SPO2 upper 90s on room air throughout ambulation; HR up to 130 bpm  Stairs            Wheelchair Mobility    Modified Rankin (Stroke Patients Only)       Balance                                             Pertinent Vitals/Pain Pain Assessment: Faces Faces Pain Scale: Hurts little more Pain Location: back Pain Descriptors / Indicators: Spasm Pain Intervention(s): Premedicated before session;Repositioned    Home Living Family/patient expects to be discharged to:: Private residence Living Arrangements: Children;Parent             Home Equipment: None Additional Comments: plans to home with her mother-in-law who has no stairs    Prior Function Level of Independence: Independent         Comments: has had caregiver s/p plastic surgery     Hand Dominance        Extremity/Trunk Assessment        Lower Extremity Assessment Lower Extremity Assessment: Overall WFL for tasks assessed    Cervical / Trunk Assessment Cervical / Trunk Assessment: Normal  Communication   Communication: No difficulties  Cognition Arousal/Alertness: Awake/alert Behavior During Therapy: WFL for tasks assessed/performed Overall Cognitive Status: Within Functional Limits for tasks assessed  General Comments      Exercises     Assessment/Plan    PT Assessment Patent does not need any further PT services  PT Problem List         PT Treatment Interventions      PT Goals (Current goals can be found in the Care Plan section)  Acute Rehab PT Goals PT Goal Formulation: All assessment and education complete, DC therapy    Frequency     Barriers to discharge        Co-evaluation               AM-PAC PT "6 Clicks" Mobility  Outcome Measure Help needed turning from your back to your side while in a flat bed without using bedrails?: None Help needed moving  from lying on your back to sitting on the side of a flat bed without using bedrails?: None Help needed moving to and from a bed to a chair (including a wheelchair)?: None Help needed standing up from a chair using your arms (e.g., wheelchair or bedside chair)?: None Help needed to walk in hospital room?: A Little Help needed climbing 3-5 steps with a railing? : A Little 6 Click Score: 22    End of Session   Activity Tolerance: Patient tolerated treatment well Patient left: with call bell/phone within reach;in bed Nurse Communication: Mobility status PT Visit Diagnosis: Difficulty in walking, not elsewhere classified (R26.2)    Time: [id number] PT Time Calculation (min) (ACUTE ONLY): 17 min   Charges:   PT Evaluation $PT Eval Low Complexity: 1 Low         Kati PT, DPT Acute Rehabilitation Services Pager: [phone] Office: [phone]   Trena Platt 11/06/2020, 12:23 PM

## 2020-11-06 NOTE — Progress Notes (Signed)
Went over discharge instructions w/ pt.  

## 2020-11-07 NOTE — Discharge Summary (Signed)
Physician Discharge Summary   Deanna Norton [id number] DOB: 1985/06/15 DOA: 11/04/2020  PCP: Jonathon Jordan, MD  Admit date: 11/04/2020 Discharge date: 11/06/2020   Admitted From: home Disposition:  home Discharging physician: Dwyane Dee, MD  Recommendations for Outpatient Follow-up:  Follow up with plastic surgery  Home Health:  Equipment/Devices:   Patient discharged to home in Discharge Condition: stable Risk of unplanned readmission score:    CODE STATUS: Full Diet recommendation:   Hospital Course:  Deanna Norton is a 35 y.o. female with medical history significant for remote history biliary atresia s/p cholecystectomy, status post tummy tuck surgery, bilateral breast lift and augmentation in [facility] with plastic surgeon last Monday...presents with concerns of right flank/lower back pain.  Work-up, CTA of the chest revealed right lower lobe subsegmental PE with no right heart strain.     Acute right lower subsegmental PE -Treated with heparin drip that was transitioned to Eliquis prior to discharge -Lower extremity duplex negative for DVT\    Status post abdominal surgery with breast lift and augmentation -Status plastic surgery on 10/29/2020 -JP drain on the right, draining serosanguineous fluid; 60 cc output on 8/29; left in place at discharge and she knows to follow up with her surgeon    Anemia -Mild likely postop mild blood loss - iron stores low, B12 low normal, folate low normal  The patient's chronic medical conditions were treated accordingly per the patient's home medication regimen except as noted.  On day of discharge, patient was felt deemed stable for discharge. Patient/family member advised to call PCP or come back to ER if needed.   Principal Diagnosis: Pulmonary embolism [facility])  Discharge Diagnoses: Active Hospital Problems   Diagnosis Date Noted   Pulmonary embolism ([place]) 11/04/2020   History of elective breast augmentation 11/05/2020    Anemia 11/05/2020    Resolved Hospital Problems  No resolved problems to display.    Discharge Instructions     Discharge wound care:   Complete by: As directed    Continue post op wound care instructions. Continue drain in abdomen. Follow up with your surgeon.   Increase activity slowly   Complete by: As directed       Allergies as of 11/06/2020       Reactions   Dilaudid [hydromorphone Hcl] Shortness Of Breath   IV dilaudid causes nerve & muscle burning   Keflex [cephalexin] Itching   States itching and c-diff   Nitrofurantoin Hives   Sulfa Antibiotics Hives        Medication List     TAKE these medications    apixaban 5 MG Tabs tablet Commonly known as: ELIQUIS Take 2 tabs twice a day until 9/4, then start taking 1 tab twice a day on 9/5   apixaban 5 MG Tabs tablet Commonly known as: ELIQUIS Take 1 tablet (5 mg total) by mouth 2 (two) times daily. Start taking on: December 07, 2020   gabapentin 300 MG capsule Commonly known as: NEURONTIN Take 1 capsule (300 mg total) by mouth 2 (two) times daily. What changed:  when to take this reasons to take this   levofloxacin 500 MG tablet Commonly known as: LEVAQUIN Take 500 mg by mouth daily.   ondansetron 4 MG tablet Commonly known as: ZOFRAN Take 4 mg by mouth every 8 (eight) hours as needed for nausea or vomiting.   oxyCODONE 5 MG immediate release tablet Commonly known as: Oxy IR/ROXICODONE Take 5-10 mg by mouth every 6 (six) hours as needed for  severe pain.               Discharge Care Instructions  (From admission, onward)           Start     Ordered   11/06/20 0000  Discharge wound care:       Comments: Continue post op wound care instructions. Continue drain in abdomen. Follow up with your surgeon.   11/06/20 1506            Follow-up Information     Pyle, Ysidro Evert, MD. Schedule an appointment as soon as possible for a visit.   Specialty: Plastic Surgery                Allergies  Allergen Reactions   Dilaudid [Hydromorphone Hcl] Shortness Of Breath    IV dilaudid causes nerve & muscle burning   Keflex [Cephalexin] Itching    States itching and c-diff   Nitrofurantoin Hives   Sulfa Antibiotics Hives    Consultations:   Discharge Exam: BP 115/72 (BP Location: Right Arm)   Pulse (!) 105   Temp 99 F (37.2 C) (Oral)   Resp 20   Ht '5\' 11"'$  (1.803 m)   Wt 86.2 kg   LMP 11/03/2020   SpO2 99%   BMI 26.50 kg/m  General appearance: alert, cooperative, and no distress Head: Normocephalic, without obvious abnormality, atraumatic Eyes:  EOMI Lungs: clear to auscultation bilaterally Heart: regular rate and rhythm and S1, S2 normal Abdomen: normal findings: bowel sounds normal, soft, non-tender, and JP drain in place with serosang fluid Extremities:  no edema Skin: mobility and turgor normal Neurologic: Grossly normal  The results of significant diagnostics from this hospitalization (including imaging, microbiology, ancillary and laboratory) are listed below for reference.   Microbiology: Recent Results (from the past 240 hour(s))  SARS CORONAVIRUS 2 (TAT 6-24 HRS) Nasopharyngeal Nasopharyngeal Swab     Status: None   Collection Time: 11/04/20 10:09 PM   Specimen: Nasopharyngeal Swab  Result Value Ref Range Status   SARS Coronavirus 2 NEGATIVE NEGATIVE Final    Comment: (NOTE) SARS-CoV-2 target nucleic acids are NOT DETECTED.  The SARS-CoV-2 RNA is generally detectable in upper and lower respiratory specimens during the acute phase of infection. Negative results do not preclude SARS-CoV-2 infection, do not rule out co-infections with other pathogens, and should not be used as the sole basis for treatment or other patient management decisions. Negative results must be combined with clinical observations, patient history, and epidemiological information. The expected result is Negative.  Fact Sheet for  Patients: SugarRoll.be  Fact Sheet for Healthcare Providers: https://www.woods-mathews.com/  This test is not yet approved or cleared by the [place] FDA and  has been authorized for detection and/or diagnosis of SARS-CoV-2 by FDA under an Emergency Use Authorization (EUA). This EUA will remain  in effect (meaning this test can be used) for the duration of the COVID-19 declaration under Se ction 564(b)(1) of the Act, 21 U.S.C. section 360bbb-3(b)(1), unless the authorization is terminated or revoked sooner.  Performed at [facility], [place] [address] [place], [place] [address]      Labs: BNP (last 3 results) No results for input(s): BNP in the last 8760 hours. Basic Metabolic Panel: Recent Labs  Lab 11/04/20 1829 11/05/20 0525  NA 139 135  K 4.0 4.1  CL 107 104  CO2 25 24  GLUCOSE 134* 113*  BUN 13 9  CREATININE 0.92 0.82  CALCIUM 8.8* 8.5*   Liver Function  Tests: Recent Labs  Lab 11/04/20 1829  AST 21  ALT 40  ALKPHOS 45  BILITOT 1.0  PROT 7.1  ALBUMIN 3.5   Recent Labs  Lab 11/04/20 1829  LIPASE 30   No results for input(s): AMMONIA in the last 168 hours. CBC: Recent Labs  Lab 11/04/20 1829 11/05/20 0525  WBC 12.2* 8.2  NEUTROABS 9.7*  --   HGB 10.9* 10.3*  HCT 33.6* 32.0*  MCV 91.1 92.2  PLT 342 278   Cardiac Enzymes: No results for input(s): CKTOTAL, CKMB, CKMBINDEX, TROPONINI in the last 168 hours. BNP: Invalid input(s): POCBNP CBG: No results for input(s): GLUCAP in the last 168 hours. D-Dimer No results for input(s): DDIMER in the last 72 hours. Hgb A1c No results for input(s): HGBA1C in the last 72 hours. Lipid Profile No results for input(s): CHOL, HDL, LDLCALC, TRIG, CHOLHDL, LDLDIRECT in the last 72 hours. Thyroid function studies No results for input(s): TSH, T4TOTAL, T3FREE, THYROIDAB in the last 72 hours.  Invalid input(s): FREET3 Anemia work up Recent Labs     11/05/20 0007  VITAMINB12 282  FOLATE 6.8  TIBC 280  IRON 10*   Urinalysis    Component Value Date/Time   COLORURINE AMBER (A) 11/04/2020 1749   APPEARANCEUR HAZY (A) 11/04/2020 1749   LABSPEC 1.043 (H) 11/04/2020 1749   PHURINE 6.0 11/04/2020 1749   GLUCOSEU NEGATIVE 11/04/2020 1749   HGBUR LARGE (A) 11/04/2020 1749   BILIRUBINUR NEGATIVE 11/04/2020 1749   BILIRUBINUR n 03/20/2015 1434   [place] 11/04/2020 1749   PROTEINUR 30 (A) 11/04/2020 1749   UROBILINOGEN 0.2 03/20/2015 1434   UROBILINOGEN 0.2 08/29/2013 0253   NITRITE NEGATIVE 11/04/2020 1749   LEUKOCYTESUR TRACE (A) 11/04/2020 1749   Sepsis Labs Invalid input(s): PROCALCITONIN,  WBC,  LACTICIDVEN Microbiology Recent Results (from the past 240 hour(s))  SARS CORONAVIRUS 2 (TAT 6-24 HRS) Nasopharyngeal Nasopharyngeal Swab     Status: None   Collection Time: 11/04/20 10:09 PM   Specimen: Nasopharyngeal Swab  Result Value Ref Range Status   SARS Coronavirus 2 NEGATIVE NEGATIVE Final    Comment: (NOTE) SARS-CoV-2 target nucleic acids are NOT DETECTED.  The SARS-CoV-2 RNA is generally detectable in upper and lower respiratory specimens during the acute phase of infection. Negative results do not preclude SARS-CoV-2 infection, do not rule out co-infections with other pathogens, and should not be used as the sole basis for treatment or other patient management decisions. Negative results must be combined with clinical observations, patient history, and epidemiological information. The expected result is Negative.  Fact Sheet for Patients: SugarRoll.be  Fact Sheet for Healthcare Providers: https://www.woods-mathews.com/  This test is not yet approved or cleared by the [place] FDA and  has been authorized for detection and/or diagnosis of SARS-CoV-2 by FDA under an Emergency Use Authorization (EUA). This EUA will remain  in effect (meaning this test can be  used) for the duration of the COVID-19 declaration under Se ction 564(b)(1) of the Act, 21 U.S.C. section 360bbb-3(b)(1), unless the authorization is terminated or revoked sooner.  Performed at [facility], [place] [address] [place], [place] [address]     Procedures/Studies: CT Angio Chest PE W/Cm &/Or Wo Cm  Result Date: 11/04/2020 CLINICAL DATA:  Shortness of breath. Right flank pain. Suspected pulmonary embolus. Recent plastic surgery. Tummy tuck. Liposuction. Breast lift. Breast augmentation. EXAM: CT ANGIOGRAPHY CHEST CT ABDOMEN AND PELVIS WITH CONTRAST TECHNIQUE: Multidetector CT imaging of the chest was performed using the standard protocol  during bolus administration of intravenous contrast. Multiplanar CT image reconstructions and MIPs were obtained to evaluate the vascular anatomy. Multidetector CT imaging of the abdomen and pelvis was performed using the standard protocol during bolus administration of intravenous contrast. CONTRAST:  171m OMNIPAQUE IOHEXOL 350 MG/ML SOLN COMPARISON:  CT chest 08/23/2019, CT abdomen pelvis 08/19/2018 FINDINGS: CTA CHEST FINDINGS Cardiovascular: Satisfactory opacification of the pulmonary arteries to the segmental level. Right lower lobe subsegmental pulmonary artery defect. The main pulmonary artery is normal in caliber. Normal heart size. Normal ventricular ratio. No pericardial effusion. Mediastinum/Nodes: No enlarged mediastinal, hilar, or axillary lymph nodes. Thyroid gland, trachea, and esophagus demonstrate no significant findings. Lungs/Pleura: Right lower lobe atelectasis. No focal consolidation. No pulmonary nodule. No pulmonary mass. No pleural effusion. No pneumothorax. No definite pulmonary infarction identified. Musculoskeletal: Bilateral breast implants. Mild subcutaneus soft tissue edema and emphysema likely postsurgical in etiology. Associated skin thickening bilaterally. No suspicious lytic or blastic osseous lesions. No acute displaced  fracture. Review of the MIP images confirms the above findings. CT ABDOMEN and PELVIS FINDINGS Hepatobiliary: No focal liver abnormality. Status post cholecystectomy. No biliary dilatation. Pancreas: No focal lesion. Normal pancreatic contour. No surrounding inflammatory changes. No main pancreatic ductal dilatation. Spleen: Normal in size without focal abnormality. Adrenals/Urinary Tract: No adrenal nodule bilaterally. Bilateral kidneys enhance symmetrically. No hydronephrosis. No hydroureter. The urinary bladder is unremarkable. Stomach/Bowel: Stomach is within normal limits. No evidence of bowel wall thickening or dilatation. Appendix appears normal. Vascular/Lymphatic: No abdominal aorta or iliac aneurysm. No abdominal, pelvic, or inguinal lymphadenopathy. Reproductive: Uterus and bilateral adnexa are unremarkable. Other: No intraperitoneal free fluid. No intraperitoneal free gas. No organized fluid collection. Musculoskeletal: Diffuse subcutaneus soft tissue edema and emphysema likely postsurgical in etiology. No organized fluid collection. A JP drain is noted within the subcutaneus soft tissues of the abdomen the level of the plication/surgical changes. No suspicious lytic or blastic osseous lesions. No acute displaced fracture. Review of the MIP images confirms the above findings. IMPRESSION: 1. Right lower lobe subsegmental pulmonary embolus. No definite pulmonary infarction. No right heart strain. 2. Mild subcutaneus soft tissue edema and emphysema of the bilateral breasts with associated overlying dermal thickening likely postsurgical in etiology status post breast surgery. No organized fluid collection. 3. Mild subcutaneus soft tissue edema and emphysema along the anterior abdomen likely postsurgical. No organized fluid collection. 4. No acute intra-abdominal or intrapelvic abnormality. These results were called by telephone at the time of interpretation on 11/04/2020 at 9:48 pm to provider MARGAUX VENTER  , who verbally acknowledged these results. Electronically Signed   By: MIven FinnM.D.   On: 11/04/2020 21:54   CT Abdomen Pelvis W Contrast  Result Date: 11/04/2020 CLINICAL DATA:  Shortness of breath. Right flank pain. Suspected pulmonary embolus. Recent plastic surgery. Tummy tuck. Liposuction. Breast lift. Breast augmentation. EXAM: CT ANGIOGRAPHY CHEST CT ABDOMEN AND PELVIS WITH CONTRAST TECHNIQUE: Multidetector CT imaging of the chest was performed using the standard protocol during bolus administration of intravenous contrast. Multiplanar CT image reconstructions and MIPs were obtained to evaluate the vascular anatomy. Multidetector CT imaging of the abdomen and pelvis was performed using the standard protocol during bolus administration of intravenous contrast. CONTRAST:  1029mOMNIPAQUE IOHEXOL 350 MG/ML SOLN COMPARISON:  CT chest 08/23/2019, CT abdomen pelvis 08/19/2018 FINDINGS: CTA CHEST FINDINGS Cardiovascular: Satisfactory opacification of the pulmonary arteries to the segmental level. Right lower lobe subsegmental pulmonary artery defect. The main pulmonary artery is normal in caliber. Normal heart size. Normal ventricular ratio. No  pericardial effusion. Mediastinum/Nodes: No enlarged mediastinal, hilar, or axillary lymph nodes. Thyroid gland, trachea, and esophagus demonstrate no significant findings. Lungs/Pleura: Right lower lobe atelectasis. No focal consolidation. No pulmonary nodule. No pulmonary mass. No pleural effusion. No pneumothorax. No definite pulmonary infarction identified. Musculoskeletal: Bilateral breast implants. Mild subcutaneus soft tissue edema and emphysema likely postsurgical in etiology. Associated skin thickening bilaterally. No suspicious lytic or blastic osseous lesions. No acute displaced fracture. Review of the MIP images confirms the above findings. CT ABDOMEN and PELVIS FINDINGS Hepatobiliary: No focal liver abnormality. Status post cholecystectomy. No biliary  dilatation. Pancreas: No focal lesion. Normal pancreatic contour. No surrounding inflammatory changes. No main pancreatic ductal dilatation. Spleen: Normal in size without focal abnormality. Adrenals/Urinary Tract: No adrenal nodule bilaterally. Bilateral kidneys enhance symmetrically. No hydronephrosis. No hydroureter. The urinary bladder is unremarkable. Stomach/Bowel: Stomach is within normal limits. No evidence of bowel wall thickening or dilatation. Appendix appears normal. Vascular/Lymphatic: No abdominal aorta or iliac aneurysm. No abdominal, pelvic, or inguinal lymphadenopathy. Reproductive: Uterus and bilateral adnexa are unremarkable. Other: No intraperitoneal free fluid. No intraperitoneal free gas. No organized fluid collection. Musculoskeletal: Diffuse subcutaneus soft tissue edema and emphysema likely postsurgical in etiology. No organized fluid collection. A JP drain is noted within the subcutaneus soft tissues of the abdomen the level of the plication/surgical changes. No suspicious lytic or blastic osseous lesions. No acute displaced fracture. Review of the MIP images confirms the above findings. IMPRESSION: 1. Right lower lobe subsegmental pulmonary embolus. No definite pulmonary infarction. No right heart strain. 2. Mild subcutaneus soft tissue edema and emphysema of the bilateral breasts with associated overlying dermal thickening likely postsurgical in etiology status post breast surgery. No organized fluid collection. 3. Mild subcutaneus soft tissue edema and emphysema along the anterior abdomen likely postsurgical. No organized fluid collection. 4. No acute intra-abdominal or intrapelvic abnormality. These results were called by telephone at the time of interpretation on 11/04/2020 at 9:48 pm to provider MARGAUX VENTER , who verbally acknowledged these results. Electronically Signed   By: Iven Finn M.D.   On: 11/04/2020 21:54   VAS [place] LOWER EXTREMITY VENOUS (DVT)  Result Date:  11/05/2020  Lower Venous DVT Study Patient Name:  DELCINE BOUTWELL  Date of Exam:   11/05/2020 Medical Rec #: [id number]      Accession #:    [id number] Date of Birth: [birth year]      Patient Gender: F Patient Age:   68 years Exam [facility]  [facility] Procedure:      VAS [place] LOWER EXTREMITY VENOUS (DVT) Referring Phys: CHING TU --------------------------------------------------------------------------------  Indications: Pulmonary embolism.  Risk Factors: Surgery. Comparison Study: No prior studies. Performing Technologist: Oliver Hum RVT  Examination Guidelines: A complete evaluation includes B-mode imaging, spectral Doppler, color Doppler, and power Doppler as needed of all accessible portions of each vessel. Bilateral testing is considered an integral part of a complete examination. Limited examinations for reoccurring indications may be performed as noted. The reflux portion of the exam is performed with the patient in reverse Trendelenburg.  +---------+---------------+---------+-----------+----------+--------------+ RIGHT    CompressibilityPhasicitySpontaneityPropertiesThrombus Aging +---------+---------------+---------+-----------+----------+--------------+ CFV      Full           Yes      Yes                                 +---------+---------------+---------+-----------+----------+--------------+ SFJ      Full                                                        +---------+---------------+---------+-----------+----------+--------------+  FV Prox  Full                                                        +---------+---------------+---------+-----------+----------+--------------+ FV Mid   Full                                                        +---------+---------------+---------+-----------+----------+--------------+ FV DistalFull                                                         +---------+---------------+---------+-----------+----------+--------------+ PFV      Full                                                        +---------+---------------+---------+-----------+----------+--------------+ POP      Full           Yes      Yes                                 +---------+---------------+---------+-----------+----------+--------------+ PTV      Full                                                        +---------+---------------+---------+-----------+----------+--------------+ PERO     Full                                                        +---------+---------------+---------+-----------+----------+--------------+   +---------+---------------+---------+-----------+----------+--------------+ LEFT     CompressibilityPhasicitySpontaneityPropertiesThrombus Aging +---------+---------------+---------+-----------+----------+--------------+ CFV      Full           Yes      Yes                                 +---------+---------------+---------+-----------+----------+--------------+ SFJ      Full                                                        +---------+---------------+---------+-----------+----------+--------------+ FV Prox  Full                                                        +---------+---------------+---------+-----------+----------+--------------+  FV Mid   Full                                                        +---------+---------------+---------+-----------+----------+--------------+ FV DistalFull                                                        +---------+---------------+---------+-----------+----------+--------------+ PFV      Full                                                        +---------+---------------+---------+-----------+----------+--------------+ POP      Full           Yes      Yes                                  +---------+---------------+---------+-----------+----------+--------------+ PTV      Full                                                        +---------+---------------+---------+-----------+----------+--------------+ PERO     Full                                                        +---------+---------------+---------+-----------+----------+--------------+     Summary: RIGHT: - There is no evidence of deep vein thrombosis in the lower extremity.  - No cystic structure found in the popliteal fossa.  LEFT: - There is no evidence of deep vein thrombosis in the lower extremity.  - No cystic structure found in the popliteal fossa.  *See table(s) above for measurements and observations. Electronically signed by Servando Snare MD on 11/05/2020 at 5:32:10 PM.    Final      Time coordinating discharge: Over 38 minutes    Dwyane Dee, MD  [facility] 11/07/2020, 4:56 PM
